# Patient Record
Sex: Female | Born: 1975
Health system: Southern US, Community
[De-identification: ages and names within clinical notes are randomized; demographics above are authoritative.]

## PROBLEM LIST (undated history)

## (undated) DIAGNOSIS — N200 Calculus of kidney: Secondary | ICD-10-CM

## (undated) HISTORY — PX: WISDOM TOOTH EXTRACTION: SHX21

## (undated) HISTORY — DX: Calculus of kidney: N20.0

---

## 2001-01-09 ENCOUNTER — Other Ambulatory Visit: Admission: RE | Admit: 2001-01-09 | Discharge: 2001-01-09 | Payer: Self-pay | Admitting: Gynecology

## 2002-04-22 ENCOUNTER — Other Ambulatory Visit: Admission: RE | Admit: 2002-04-22 | Discharge: 2002-04-22 | Payer: Self-pay | Admitting: Gynecology

## 2003-06-22 ENCOUNTER — Other Ambulatory Visit: Admission: RE | Admit: 2003-06-22 | Discharge: 2003-06-22 | Payer: Self-pay | Admitting: Gynecology

## 2003-10-02 ENCOUNTER — Inpatient Hospital Stay (HOSPITAL_COMMUNITY): Admission: AD | Admit: 2003-10-02 | Discharge: 2003-10-02 | Payer: Self-pay | Admitting: Obstetrics and Gynecology

## 2003-11-09 ENCOUNTER — Emergency Department (HOSPITAL_COMMUNITY): Admission: EM | Admit: 2003-11-09 | Discharge: 2003-11-09 | Payer: Self-pay | Admitting: Emergency Medicine

## 2004-06-22 ENCOUNTER — Other Ambulatory Visit: Admission: RE | Admit: 2004-06-22 | Discharge: 2004-06-22 | Payer: Self-pay | Admitting: Gynecology

## 2005-04-17 ENCOUNTER — Other Ambulatory Visit: Admission: RE | Admit: 2005-04-17 | Discharge: 2005-04-17 | Payer: Self-pay | Admitting: Gynecology

## 2005-09-11 ENCOUNTER — Inpatient Hospital Stay (HOSPITAL_COMMUNITY): Admission: AD | Admit: 2005-09-11 | Discharge: 2005-09-13 | Payer: Self-pay | Admitting: Gynecology

## 2005-10-26 ENCOUNTER — Other Ambulatory Visit: Admission: RE | Admit: 2005-10-26 | Discharge: 2005-10-26 | Payer: Self-pay | Admitting: Gynecology

## 2006-10-24 ENCOUNTER — Other Ambulatory Visit: Admission: RE | Admit: 2006-10-24 | Discharge: 2006-10-24 | Payer: Self-pay | Admitting: Surgical Oncology

## 2007-10-29 ENCOUNTER — Other Ambulatory Visit: Admission: RE | Admit: 2007-10-29 | Discharge: 2007-10-29 | Payer: Self-pay | Admitting: Gynecology

## 2008-10-29 ENCOUNTER — Ambulatory Visit: Payer: Self-pay | Admitting: Gynecology

## 2008-10-29 ENCOUNTER — Encounter: Payer: Self-pay | Admitting: Gynecology

## 2008-10-29 ENCOUNTER — Other Ambulatory Visit: Admission: RE | Admit: 2008-10-29 | Discharge: 2008-10-29 | Payer: Self-pay | Admitting: Gynecology

## 2009-11-17 ENCOUNTER — Other Ambulatory Visit: Admission: RE | Admit: 2009-11-17 | Discharge: 2009-11-17 | Payer: Self-pay | Admitting: Gynecology

## 2009-11-17 ENCOUNTER — Ambulatory Visit: Payer: Self-pay | Admitting: Gynecology

## 2010-05-09 ENCOUNTER — Ambulatory Visit (INDEPENDENT_AMBULATORY_CARE_PROVIDER_SITE_OTHER): Payer: 59 | Admitting: Gynecology

## 2010-05-09 DIAGNOSIS — N949 Unspecified condition associated with female genital organs and menstrual cycle: Secondary | ICD-10-CM

## 2010-05-09 DIAGNOSIS — Z1211 Encounter for screening for malignant neoplasm of colon: Secondary | ICD-10-CM

## 2010-05-11 ENCOUNTER — Other Ambulatory Visit: Payer: 59

## 2010-05-11 ENCOUNTER — Ambulatory Visit: Payer: 59 | Admitting: Gynecology

## 2010-05-15 ENCOUNTER — Ambulatory Visit (INDEPENDENT_AMBULATORY_CARE_PROVIDER_SITE_OTHER): Payer: 59 | Admitting: Gynecology

## 2010-05-15 ENCOUNTER — Other Ambulatory Visit: Payer: 59

## 2010-05-15 DIAGNOSIS — N831 Corpus luteum cyst of ovary, unspecified side: Secondary | ICD-10-CM

## 2010-05-15 DIAGNOSIS — N949 Unspecified condition associated with female genital organs and menstrual cycle: Secondary | ICD-10-CM

## 2010-05-15 DIAGNOSIS — Z30431 Encounter for routine checking of intrauterine contraceptive device: Secondary | ICD-10-CM

## 2010-10-10 ENCOUNTER — Encounter: Payer: Self-pay | Admitting: *Deleted

## 2010-10-10 ENCOUNTER — Telehealth: Payer: Self-pay | Admitting: *Deleted

## 2010-10-10 NOTE — Progress Notes (Signed)
Ca

## 2010-10-10 NOTE — Telephone Encounter (Signed)
Called patient about IUD benefits.  Her part would be $1008. Patient decided she will just have aex appointment and probably just remove IUD and not insert because of cost. Updated appointment info.

## 2010-11-13 ENCOUNTER — Encounter: Payer: Self-pay | Admitting: Anesthesiology

## 2010-11-16 ENCOUNTER — Ambulatory Visit: Payer: 59 | Admitting: Gynecology

## 2010-11-21 ENCOUNTER — Encounter: Payer: Self-pay | Admitting: Gynecology

## 2010-11-21 ENCOUNTER — Other Ambulatory Visit (HOSPITAL_COMMUNITY)
Admission: RE | Admit: 2010-11-21 | Discharge: 2010-11-21 | Disposition: A | Discharge status: Home / Self Care | Payer: 59 | Source: Ambulatory Visit | Attending: Gynecology | Admitting: Gynecology

## 2010-11-21 ENCOUNTER — Other Ambulatory Visit: Payer: Self-pay | Admitting: Gynecology

## 2010-11-21 ENCOUNTER — Ambulatory Visit (INDEPENDENT_AMBULATORY_CARE_PROVIDER_SITE_OTHER): Payer: 59 | Admitting: Gynecology

## 2010-11-21 DIAGNOSIS — Z13220 Encounter for screening for lipoid disorders: Secondary | ICD-10-CM

## 2010-11-21 DIAGNOSIS — Z3201 Encounter for pregnancy test, result positive: Secondary | ICD-10-CM

## 2010-11-21 DIAGNOSIS — R82998 Other abnormal findings in urine: Secondary | ICD-10-CM

## 2010-11-21 DIAGNOSIS — Z01419 Encounter for gynecological examination (general) (routine) without abnormal findings: Secondary | ICD-10-CM | POA: Insufficient documentation

## 2010-11-21 NOTE — Progress Notes (Signed)
Heather Anderson 1975/06/19 [id number]   History:    35 y.o.  for annual exam without any complaints. Patient is due to have her Mirena IUD removed today. Patient will be using barrier contraception until she schedules ferning Mirena IUD to be placed in a few months. She does her monthly self breast examination.  Past medical history,surgical history, family history and social history were all reviewed and documented in the EPIC chart.  ROS:  Was performed and pertinent positives and negatives are included in the history.  Exam: chaperone present There were no vitals taken for this visit.  There is no height or weight on file to calculate BMI.  General appearance : Well developed well nourished female. No acute distress HEENT: Neck supple, trachea midline, no carotid bruits, no thyroidmegaly Lungs: Clear to auscultation, no rhonchi or wheezes, or rib retractions  Heart: Regular rate and rhythm, no murmurs or gallops Breast:Examined in sitting and supine position were symmetrical in appearance, no palpable masses or tenderness,  no skin retraction, no nipple inversion, no nipple discharge, no skin discoloration, no axillary or supraclavicular lymphadenopathy Abdomen: no palpable masses or tenderness, no rebound or guarding Extremities: no edema or skin discoloration or tenderness  Pelvic:  Bartholin, Urethra, Skene Glands: Within normal limits             Vagina: No gross lesions or discharge  Cervix: No gross lesions or discharge  Uterus  anteverted, normal size, shape and consistency, non-tender and mobile  Adnexa  Without masses or tenderness  Anus and perineum  normal   Rectovaginal  normal sphincter tone without palpated masses or tenderness             Hemoccult not done     Assessment/Plan:  35 y.o. female for annual exam unremarkable. Mirena IUD was removed in a sterile fashion shown to the patient and discarded. CBC urinalysis cholesterol and Pap smear was done today. Requisition  to schedule her baseline mammogram was provided. She was encouraged to continue monthly self breast examination. See her back in one year or when necessary.    Ok Edwards MD, 11:33 AM 11/21/2010

## 2010-11-24 ENCOUNTER — Telehealth: Payer: Self-pay | Admitting: *Deleted

## 2010-11-24 NOTE — Telephone Encounter (Signed)
Pt informed that post bleeding after IUD removal is normal, told pt to monitor bleeding and call office if bleeding continues.

## 2010-11-24 NOTE — Telephone Encounter (Signed)
Pt called c/o bleeding from post iud removal. Left message for pt to call.

## 2011-02-13 ENCOUNTER — Telehealth: Payer: Self-pay | Admitting: *Deleted

## 2011-02-13 NOTE — Telephone Encounter (Signed)
Patient states ready to discuss birth control options.  Appts to set up with JF.

## 2011-02-15 ENCOUNTER — Ambulatory Visit (INDEPENDENT_AMBULATORY_CARE_PROVIDER_SITE_OTHER): Payer: 59 | Admitting: Gynecology

## 2011-02-15 ENCOUNTER — Encounter: Payer: Self-pay | Admitting: Gynecology

## 2011-02-15 VITALS — BP 112/70

## 2011-02-15 DIAGNOSIS — Z309 Encounter for contraceptive management, unspecified: Secondary | ICD-10-CM

## 2011-02-15 DIAGNOSIS — IMO0001 Reserved for inherently not codable concepts without codable children: Secondary | ICD-10-CM

## 2011-02-15 DIAGNOSIS — Z23 Encounter for immunization: Secondary | ICD-10-CM

## 2011-02-15 MED ORDER — LEVONORGESTREL-ETHINYL ESTRAD 0.1-20 MG-MCG PO TABS: 1.0000 | ORAL_TABLET | Freq: Every day | ORAL | Status: DC

## 2011-02-15 NOTE — Progress Notes (Signed)
Patient is a 35 year old gravida 3 or 1 AB 2 who was seen recently in the office for her annual gynecological examination and her Mirena IUD was due for removal. She was going to wait and use barrier contraception and presented to the office today for consultation. The Mirena IUD was removed and the last office visit. She's currently on her second day of her menstrual cycle. Because of her high insurance deductible she would like to wait for later in the year to have the Mirena IUD replaced and was here to discuss alternatives. We will with doing a lengthy discussion of contraceptive options to include the following:  A. Depo-Provera every 3 months B. Nexplanon subdermal implant to 3 years C. Oral contraceptive pill daily D barrier contraception E. Laparoscopic tubal ligation F. Hysteroscopic sterilization  Patient is going to be remarried and within the next 2 years and would like to maintain her fertility. She had stated several years ago she had been on oral contraceptive pills to regulate her cycles since she has menorrhagia. She denies any family history of any thrombophilia. She is a nonsmoker. The risks benefits and pros and cons of oral contraceptive pills at the age of 80 were discussed to include DVT. She fully understands and accepts. She will be placed on Alesse 35 year old contraceptive pill which she will start tomorrow. She requested to have the flu vaccine placed today which is administered. Her recent Pap smear and labs were all normal and we will see her back in the year for her annual exam or when necessary.

## 2011-02-15 NOTE — Patient Instructions (Signed)
Start oral contraception tomorrow. Prescription called in.

## 2011-02-15 NOTE — Progress Notes (Signed)
Addended by: Bertram Savin A on: 02/15/2011 10:15 AM   Modules accepted: Orders

## 2011-05-08 ENCOUNTER — Telehealth: Payer: Self-pay | Admitting: *Deleted

## 2011-05-08 NOTE — Telephone Encounter (Signed)
Pt c/o bleeding with birth control pills. She had her IUD removed in 2012, pt said that she missed 1 day and double up on next day and now is having bleeding. Bleeding started last week, pt said this is the normal week her period would start & she has been until a lot of stress. I told pt to watch for now and call back if bleeding continues to call. Pt okay with this and will do.

## 2011-11-29 ENCOUNTER — Encounter: Payer: Self-pay | Admitting: Gynecology

## 2011-11-29 ENCOUNTER — Ambulatory Visit (INDEPENDENT_AMBULATORY_CARE_PROVIDER_SITE_OTHER): Payer: Self-pay | Admitting: Gynecology

## 2011-11-29 VITALS — BP 128/74 | Ht 60.0 in | Wt 116.0 lb

## 2011-11-29 DIAGNOSIS — Z01419 Encounter for gynecological examination (general) (routine) without abnormal findings: Secondary | ICD-10-CM

## 2011-11-29 NOTE — Progress Notes (Signed)
Heather Anderson 02-Feb-1976 [id number]   History:    36 y.o.  for annual gyn exam with no complaints today. Patient had a Mirena IUD removed in 2002. She is on Alesse 28 day oral contraceptive pills and is having normal menstrual cycle. Patient does her monthly self breast examination. No prior history of abnormal Pap smears. Last normal Pap smear 2012 normal. Patient has not received a dTap Vaccine.  Past medical history,surgical history, family history and social history were all reviewed and documented in the EPIC chart.  Gynecologic History Patient's last menstrual period was 11/21/2011. Contraception: OCP (estrogen/progesterone) Last Pap: 2012. Results were: normal Last mammogram: Not indicated. Results were: Not indicated  Obstetric History OB History    Grav Para Term Preterm Abortions TAB SAB Ect Mult Living   3 1 1  2  2   1      # Outc Date GA Lbr Len/2nd Wgt Sex Del Anes PTL Lv   1 TRM     M SVD  No Yes   2 SAB            3 SAB                ROS: A ROS was performed and pertinent positives and negatives are included in the history.  GENERAL: No fevers or chills. HEENT: No change in vision, no earache, sore throat or sinus congestion. NECK: No pain or stiffness. CARDIOVASCULAR: No chest pain or pressure. No palpitations. PULMONARY: No shortness of breath, cough or wheeze. GASTROINTESTINAL: No abdominal pain, nausea, vomiting or diarrhea, melena or bright red blood per rectum. GENITOURINARY: No urinary frequency, urgency, hesitancy or dysuria. MUSCULOSKELETAL: No joint or muscle pain, no back pain, no recent trauma. DERMATOLOGIC: No rash, no itching, no lesions. ENDOCRINE: No polyuria, polydipsia, no heat or cold intolerance. No recent change in weight. HEMATOLOGICAL: No anemia or easy bruising or bleeding. NEUROLOGIC: No headache, seizures, numbness, tingling or weakness. PSYCHIATRIC: No depression, no loss of interest in normal activity or change in sleep pattern.      Exam: chaperone present  BP 128/74  Ht 5' (1.524 m)  Wt 116 lb (52.617 kg)  BMI 22.65 kg/m2  LMP 11/21/2011  Body mass index is 22.65 kg/(m^2).  General appearance : Well developed well nourished female. No acute distress HEENT: Neck supple, trachea midline, no carotid bruits, no thyroidmegaly Lungs: Clear to auscultation, no rhonchi or wheezes, or rib retractions  Heart: Regular rate and rhythm, no murmurs or gallops Breast:Examined in sitting and supine position were symmetrical in appearance, no palpable masses or tenderness,  no skin retraction, no nipple inversion, no nipple discharge, no skin discoloration, no axillary or supraclavicular lymphadenopathy Abdomen: no palpable masses or tenderness, no rebound or guarding Extremities: no edema or skin discoloration or tenderness  Pelvic:  Bartholin, Urethra, Skene Glands: Within normal limits             Vagina: No gross lesions or discharge  Cervix: No gross lesions or discharge  Uterus  anteverted, normal size, shape and consistency, non-tender and mobile  Adnexa  Without masses or tenderness  Anus and perineum  normal   Rectovaginal  normal sphincter tone without palpated masses or tenderness             Hemoccult not done     Assessment/Plan:  35 y.o. female for annual exam who was to receive her dTap Vaccine today. No abnormalities on gynecological examination. The following labs were were today: CBC, TSH, screening  cholesterol, random blood sugar, urinalysis. We discussed the new Pap smear screening guidelines and she will not need a Pap smear this year. She was reminded on the importance of monthly self breast examination. We also discussed importance of calcium vitamin D for osteoporosis prevention.    Ok Edwards MD, 6:18 PM 11/29/2011

## 2011-11-29 NOTE — Patient Instructions (Addendum)
Diphtheria, Tetanus, and Pertussis (DTaP) Vaccine What You Need to Know WHY GET VACCINATED? Diphtheria, tetanus, and pertussis are serious diseases caused by bacteria. Diphtheria and pertussis are spread from person to person. Tetanus enters the body through cuts or wounds. Diphtheria causes a thick covering in the back of the throat.  It can lead to breathing problems, paralysis, heart failure, and even death.  Tetanus (Lockjaw) causes painful tightening of the muscles, usually all over the body.  It can lead to "locking" of the jaw so the victim cannot open his or her mouth or swallow. Tetanus leads to death in about 2 out of 10 cases.  Pertussis (Whooping Cough) causes coughing spells so bad that it is hard for infants to eat, drink, or breathe. These spells can last for weeks.  It can lead to pneumonia, seizures (jerking and staring spells), brain damage, and death.  Diphtheria, tetanus, and pertussis vaccine (DTaP) can help prevent these diseases. Most children who are vaccinated with DTaP will be protected throughout childhood. Many more children would get these diseases if we stopped vaccinating. DTaP is a safer version of an older vaccine called DTP. DTP is no longer used in the [place]. WHO SHOULD GET DTAP VACCINE AND WHEN? Children should get 5 doses of DTaP vaccine, 1 dose at each of the following ages:  2 months.   4 months.   6 months.   15 to 18 months.   4 to 6 years.  DTaP may be given at the same time as other vaccines. SOME CHILDREN SHOULD NOT GET DTAP VACCINE OR SHOULD WAIT  Children with minor illnesses, such as a cold, may be vaccinated. But children who are moderately or severely ill should usually wait until they recover before getting DTaP vaccine.   Any child who had a life-threatening allergic reaction after a dose of DTaP should not get another dose.   Any child who suffered a brain or nervous system disease within 7 days after a dose of DTaP should  not get another dose.   Talk with your caregiver if your child:   Had a seizure or collapsed after a dose of DTaP.   Cried non-stop for 3 hours or more after a dose of DTaP.   Had a fever over 105 F (40.6 C) after a dose of DTaP.   Ask your caregiver for more information. Some of these children should not get another dose of pertussis vaccine, but may get a vaccine without pertussis, called DT.  OLDER CHILDREN AND ADULTS  DTaP is not licensed for adolescents, adults, or children 1 years of age and older.   But older people still need protection. A vaccine called Tdap is similar to DTaP. A single dose of Tdap is recommended for people 11 through 36 years of age. Another vaccine, called Td, protects against tetanus and diphtheria, but not pertussis. It is recommended every 10 years.  WHAT ARE THE RISKS FROM DTAP VACCINE?  Getting diphtheria, tetanus, or pertussis disease is much riskier than getting DTaP vaccine.   However, a vaccine, like any medicine, is capable of causing serious problems, such as severe allergic reactions. The risk of DTaP vaccine causing serious harm, or death, is extremely small.  Mild Problems (Common)  Fever (up to about 1 child in 4).   Redness or swelling where the shot was given (up to about 1 child in 4).   Soreness or tenderness where the shot was given (up to about 1 child in 4).  These problems occur more often after the 4th and 5th doses of the DTaP series than after earlier doses. Sometimes the 4th or 5th dose of DTaP vaccine is followed by swelling of the entire arm or leg in which the shot was given, lasting 1 to 7 days (up to about 1 child in 30). Other mild problems include:  Fussiness (up to about 1 child in 3).   Tiredness or poor appetite (up to about 1 child in 10).   Vomiting (up to about 1 child in 50).  These problems generally occur 1 to 3 days after the shot. Moderate Problems (Uncommon)  Seizure (jerking or staring) (about 1 child  out of 14,000).   Non-stop crying, for 3 hours or more (up to about 1 child out of 1,000).   High fever, over 105 F (40.6 C) (about 1 child out of 16,000).  Severe Problems (Very Rare)  Serious allergic reaction (less than 1 out of a million doses).   Several other severe problems have been reported after DTaP vaccine. These include:   Long-term seizures, coma, or lowered consciousness.   Permanent brain damage.  These are so rare it is hard to tell if they are caused by the vaccine. Controlling fever is especially important for children who have had seizures, for any reason. It is also important if another family member has had seizures. You can reduce fever and pain by giving your child an aspirin-free pain reliever when the shot is given, and for the next 24 hours, following the package instructions. WHAT IF THERE IS A MODERATE OR SEVERE REACTION? What should I look for? Any unusual conditions, such as a serious allergic reaction, high fever, or unusual behavior. Serious allergic reactions are extremely rare with any vaccine. If one were to occur, it would most likely be within a few minutes to a few hours after the shot. Signs can include difficulty breathing, hoarseness or wheezing, hives, paleness, weakness, a fast heartbeat, or dizziness. If a high fever or seizure were to occur, it would usually be within a week after the shot. What should I do?  Call your caregiver or get the person to a caregiver right away.   Tell the caregiver what happened, the date and time it happened, and when the vaccination was given.   Ask the caregiver, nurse, or health department to file a Vaccine Adverse Event Reporting System (VAERS) form. Or, you can file this report through the VAERS website at www.vaers.LAgents.no or by calling 1-(563)628-9859.  VAERS does not provide medical advice. THE NATIONAL VACCINE INJURY COMPENSATION PROGRAM  In the rare event that you or your child has a serious reaction  to a vaccine, a federal program has been created to help you pay for the care of those who have been harmed.   For details about the National Vaccine Injury Compensation Program, call 315-830-0253 or visit the program's website at SpiritualWord.at  HOW CAN I LEARN MORE?  Ask your caregiver. They can give you the vaccine package insert or suggest other sources of information.   Call your local or state health department's immunization program.   Contact the Centers for Disease Control and Prevention (CDC):   Call 778-515-9892 (1-800-CDC-INFO).   Visit the [employer] at PicCapture.uy  CDC Diphtheria, Tetanus, and Pertussis (DTaP) Vaccine VIS (07/19/05) Document Released: 12/17/2005 Document Revised: 02/08/2011 Document Reviewed: 12/17/2005 [facility] Patient Information 2012 [place], [place].

## 2011-11-30 ENCOUNTER — Encounter: Payer: Self-pay | Admitting: Gynecology

## 2011-11-30 LAB — TSH: TSH: 0.428 u[IU]/mL (ref 0.350–4.500)

## 2011-11-30 LAB — GLUCOSE, RANDOM: Glucose, Bld: 73 mg/dL (ref 70–99)

## 2011-11-30 LAB — CBC WITH DIFFERENTIAL/PLATELET
Basophils Relative: 1 % (ref 0–1)
Hemoglobin: 13.4 g/dL (ref 12.0–15.0)
MCV: 84.4 fL (ref 78.0–100.0)
Neutro Abs: 2.4 10*3/uL (ref 1.7–7.7)
Neutrophils Relative %: 50 % (ref 43–77)
RBC: 4.81 MIL/uL (ref 3.87–5.11)
RDW: 14 % (ref 11.5–15.5)
WBC: 4.7 10*3/uL (ref 4.0–10.5)

## 2012-01-18 ENCOUNTER — Other Ambulatory Visit: Payer: Self-pay | Admitting: *Deleted

## 2012-01-18 MED ORDER — LEVONORGESTREL-ETHINYL ESTRAD 0.1-20 MG-MCG PO TABS: 1.0000 | ORAL_TABLET | Freq: Every day | ORAL | Status: DC

## 2012-03-12 ENCOUNTER — Other Ambulatory Visit: Payer: Self-pay

## 2012-03-12 MED ORDER — LEVONORGESTREL-ETHINYL ESTRAD 0.1-20 MG-MCG PO TABS: 1.0000 | ORAL_TABLET | Freq: Every day | ORAL | Status: DC

## 2012-11-14 ENCOUNTER — Other Ambulatory Visit: Payer: Self-pay | Admitting: Gynecology

## 2012-12-18 ENCOUNTER — Other Ambulatory Visit: Payer: Self-pay | Admitting: Gynecology

## 2012-12-24 ENCOUNTER — Ambulatory Visit (INDEPENDENT_AMBULATORY_CARE_PROVIDER_SITE_OTHER): Payer: Self-pay | Admitting: Gynecology

## 2012-12-24 ENCOUNTER — Encounter: Payer: Self-pay | Admitting: Gynecology

## 2012-12-24 VITALS — BP 110/70 | Ht 60.0 in | Wt 111.0 lb

## 2012-12-24 DIAGNOSIS — Z23 Encounter for immunization: Secondary | ICD-10-CM

## 2012-12-24 DIAGNOSIS — Z01419 Encounter for gynecological examination (general) (routine) without abnormal findings: Secondary | ICD-10-CM

## 2012-12-24 LAB — CBC WITH DIFFERENTIAL/PLATELET
Basophils Absolute: 0 10*3/uL (ref 0.0–0.1)
Basophils Relative: 1 % (ref 0–1)
Eosinophils Absolute: 0 10*3/uL (ref 0.0–0.7)
Eosinophils Relative: 1 % (ref 0–5)
HCT: 39.9 % (ref 36.0–46.0)
Hemoglobin: 13.5 g/dL (ref 12.0–15.0)
Lymphocytes Relative: 59 % — ABNORMAL HIGH (ref 12–46)
Lymphs Abs: 1.8 10*3/uL (ref 0.7–4.0)
MCH: 28.1 pg (ref 26.0–34.0)
MCHC: 33.8 g/dL (ref 30.0–36.0)
MCV: 83.1 fL (ref 78.0–100.0)
Monocytes Absolute: 0.1 10*3/uL (ref 0.1–1.0)
Monocytes Relative: 5 % (ref 3–12)
Neutro Abs: 1.1 10*3/uL — ABNORMAL LOW (ref 1.7–7.7)
Neutrophils Relative %: 34 % — ABNORMAL LOW (ref 43–77)
Platelets: 240 10*3/uL (ref 150–400)
RBC: 4.8 MIL/uL (ref 3.87–5.11)
RDW: 14.1 % (ref 11.5–15.5)
WBC: 3 10*3/uL — ABNORMAL LOW (ref 4.0–10.5)

## 2012-12-24 LAB — HEMOGLOBIN A1C
Hgb A1c MFr Bld: 5.5 % (ref <5.7)
Mean Plasma Glucose: 111 mg/dL (ref <117)

## 2012-12-24 LAB — URINALYSIS W MICROSCOPIC + REFLEX CULTURE
Bacteria, UA: NONE SEEN
Bilirubin Urine: NEGATIVE
Crystals: NONE SEEN
Leukocytes, UA: NEGATIVE
Protein, ur: NEGATIVE mg/dL
Specific Gravity, Urine: 1.025 (ref 1.005–1.030)
Urobilinogen, UA: 1 mg/dL (ref 0.0–1.0)

## 2012-12-24 MED ORDER — LEVONORGESTREL-ETHINYL ESTRAD 0.1-20 MG-MCG PO TABS: 1.0000 | ORAL_TABLET | Freq: Every day | ORAL | Status: DC

## 2012-12-24 NOTE — Patient Instructions (Signed)
Influenza Vaccine (Flu Vaccine, Inactivated) 2013 2014 What You Need to Know WHY GET VACCINATED?  Influenza ("flu") is a contagious disease that spreads around the [place] every winter, usually between October and May.  Flu is caused by the influenza virus, and can be spread by coughing, sneezing, and close contact.  Anyone can get flu, but the risk of getting flu is highest among children. Symptoms come on suddenly and may last several days. They can include:  Fever or chills.  Sore throat.  Muscle aches.  Fatigue.  Cough.  Headache.  Runny or stuffy nose. Flu can make some people much sicker than others. These people include young children, people 65 and older, pregnant women, and people with certain health conditions such as heart, lung or kidney disease, or a weakened immune system. Flu vaccine is especially important for these people, and anyone in close contact with them. Flu can also lead to pneumonia, and make existing medical conditions worse. It can cause diarrhea and seizures in children. Each year thousands of people in the [place] die from flu, and many more are hospitalized. Flu vaccine is the best protection we have from flu and its complications. Flu vaccine also helps prevent spreading flu from person to person. INACTIVATED FLU VACCINE There are 2 types of influenza vaccine:  You are getting an inactivated flu vaccine, which does not contain any live influenza virus. It is given by injection with a needle, and often called the "flu shot."  A different live, attenuated (weakened) influenza vaccine is sprayed into the nostrils. This vaccine is described in a separate Vaccine Information Statement. Flu vaccine is recommended every year. Children 6 months through 8 years of age should get 2 doses the first year they get vaccinated. Flu viruses are always changing. Each year's flu vaccine is made to protect from viruses that are most likely to cause disease  that year. While flu vaccine cannot prevent all cases of flu, it is our best defense against the disease. Inactivated flu vaccine protects against 3 or 4 different influenza viruses. It takes about 2 weeks for protection to develop after the vaccination, and protection lasts several months to a year. Some illnesses that are not caused by influenza virus are often mistaken for flu. Flu vaccine will not prevent these illnesses. It can only prevent influenza. A "high-dose" flu vaccine is available for people 65 years of age and older. The person giving you the vaccine can tell you more about it. Some inactivated flu vaccine contains a very small amount of a mercury-based preservative called thimerosal. Studies have shown that thimerosal in vaccines is not harmful, but flu vaccines that do not contain a preservative are available. SOME PEOPLE SHOULD NOT GET THIS VACCINE Tell the person who gives you the vaccine:  If you have any severe (life-threatening) allergies. If you ever had a life-threatening allergic reaction after a dose of flu vaccine, or have a severe allergy to any part of this vaccine, you may be advised not to get a dose. Most, but not all, types of flu vaccine contain a small amount of egg.  If you ever had Guillain Barr Syndrome (a severe paralyzing illness, also called GBS). Some people with a history of GBS should not get this vaccine. This should be discussed with your doctor.  If you are not feeling well. They might suggest waiting until you feel better. But you should come back. RISKS OF A VACCINE REACTION With a vaccine, like any medicine, there   is a chance of side effects. These are usually mild and go away on their own. Serious side effects are also possible, but are very rare. Inactivated flu vaccine does not contain live flu virus, sogetting flu from this vaccine is not possible. Brief fainting spells and related symptoms (such as jerking movements) can happen after any medical  procedure, including vaccination. Sitting or lying down for about 15 minutes after a vaccination can help prevent fainting and injuries caused by falls. Tell your doctor if you feel dizzy or lightheaded, or have vision changes or ringing in the ears. Mild problems following inactivated flu vaccine:  Soreness, redness, or swelling where the shot was given.  Hoarseness; sore, red or itchy eyes; or cough.  Fever.  Aches.  Headache.  Itching.  Fatigue. If these problems occur, they usually begin soon after the shot and last 1 or 2 days. Moderate problems following inactivated flu vaccine:  Young children who get inactivated flu vaccine and pneumococcal vaccine (PCV13) at the same time may be at increased risk for seizures caused by fever. Ask your doctor for more information. Tell your doctor if a child who is getting flu vaccine has ever had a seizure. Severe problems following inactivated flu vaccine:  A severe allergic reaction could occur after any vaccine (estimated less than 1 in a million doses).  There is a small possibility that inactivated flu vaccine could be associated with Guillan Barr Syndrome (GBS), no more than 1 or 2 cases per million people vaccinated. This is much lower than the risk of severe complications from flu, which can be prevented by flu vaccine. The safety of vaccines is always being monitored. For more information, visit: www.cdc.gov/vaccinesafety/ WHAT IF THERE IS A SERIOUS REACTION? What should I look for?  Look for anything that concerns you, such as signs of a severe allergic reaction, very high fever, or behavior changes. Signs of a severe allergic reaction can include hives, swelling of the face and throat, difficulty breathing, a fast heartbeat, dizziness, and weakness. These would start a few minutes to a few hours after the vaccination. What should I do?  If you think it is a severe allergic reaction or other emergency that cannot wait, call 9 1 1  or get the person to the nearest hospital. Otherwise, call your doctor.  Afterward, the reaction should be reported to the Vaccine Adverse Event Reporting System (VAERS). Your doctor might file this report, or you can do it yourself through the VAERS website at www.vaers.hhs.gov, or by calling 1-800-822-7967. VAERS is only for reporting reactions. They do not give medical advice. THE NATIONAL VACCINE INJURY COMPENSATION PROGRAM The National Vaccine Injury Compensation Program (VICP) is a federal program that was created to compensate people who may have been injured by certain vaccines. Persons who believe they may have been injured by a vaccine can learn about the program and about filing a claim by calling 1-800-338-2382 or visiting the VICP website at www.hrsa.gov/vaccinecompensation HOW CAN I LEARN MORE?  Ask your doctor.  Call your local or state health department.  Contact the Centers for Disease Control and Prevention (CDC):  Call 1-800-232-4636 (1-800-CDC-INFO) or  Visit CDC's website at www.cdc.gov/flu CDC Inactivated Influenza Vaccine Interim VIS (09/28/11) Document Released: 12/14/2005 Document Revised: 11/14/2011 Document Reviewed: 09/28/2011 [employer] Patient Information 2014 [employer].  

## 2012-12-24 NOTE — Progress Notes (Signed)
Heather Anderson Feb 25, 1976 [id number]   History:    37 y.o.  for annual gyn Patient had a Mirena IUD removed in 2002. She is on Alesse 28 day oral contraceptive pills and is having normal menstrual cycle. Patient does her monthly self breast examination. No prior history of abnormal Pap smears. Last normal Pap smear 2012 normal.    Past medical history,surgical history, family history and social history were all reviewed and documented in the EPIC chart.  Gynecologic History Patient's last menstrual period was 12/16/2012. Contraception: OCP (estrogen/progesterone) Last Pap: 2012. Results were: normal Last mammogram: none indicated. Results were: none indicated  Obstetric History OB History  Gravida Para Term Preterm AB SAB TAB Ectopic Multiple Living  3 1 1  2 2    1     # Outcome Date GA Lbr Len/2nd Weight Sex Delivery Anes PTL Lv  3 SAB           2 SAB           1 TRM     M SVD  N Y       ROS: A ROS was performed and pertinent positives and negatives are included in the history.  GENERAL: No fevers or chills. HEENT: No change in vision, no earache, sore throat or sinus congestion. NECK: No pain or stiffness. CARDIOVASCULAR: No chest pain or pressure. No palpitations. PULMONARY: No shortness of breath, cough or wheeze. GASTROINTESTINAL: No abdominal pain, nausea, vomiting or diarrhea, melena or bright red blood per rectum. GENITOURINARY: No urinary frequency, urgency, hesitancy or dysuria. MUSCULOSKELETAL: No joint or muscle pain, no back pain, no recent trauma. DERMATOLOGIC: No rash, no itching, no lesions. ENDOCRINE: No polyuria, polydipsia, no heat or cold intolerance. No recent change in weight. HEMATOLOGICAL: No anemia or easy bruising or bleeding. NEUROLOGIC: No headache, seizures, numbness, tingling or weakness. PSYCHIATRIC: No depression, no loss of interest in normal activity or change in sleep pattern.     Exam: chaperone present  BP 110/70  Ht 5' (1.524 m)  Wt 111 lb (50.349  kg)  BMI 21.68 kg/m2  LMP 12/16/2012  Body mass index is 21.68 kg/(m^2).  General appearance : Well developed well nourished female. No acute distress HEENT: Neck supple, trachea midline, no carotid bruits, no thyroidmegaly Lungs: Clear to auscultation, no rhonchi or wheezes, or rib retractions  Heart: Regular rate and rhythm, no murmurs or gallops Breast:Examined in sitting and supine position were symmetrical in appearance, no palpable masses or tenderness,  no skin retraction, no nipple inversion, no nipple discharge, no skin discoloration, no axillary or supraclavicular lymphadenopathy Abdomen: no palpable masses or tenderness, no rebound or guarding Extremities: no edema or skin discoloration or tenderness  Pelvic:  Bartholin, Urethra, Skene Glands: Within normal limits             Vagina: No gross lesions or discharge  Cervix: No gross lesions or discharge  Uterus  anteverted, normal size, shape and consistency, non-tender and mobile  Adnexa  Without masses or tenderness  Anus and perineum  normal   Rectovaginal  normal sphincter tone without palpated masses or tenderness             Hemoccult none indicated     Assessment/Plan:  37 y.o. female for annual exam with no abnormalities detected. Pap smear was not done today in accordance with the new guidelines. The following labs were ordered: CBC, TSH, screening cholesterol, urinalysis and hemoglobin A 1C. We discussed importance of calcium vitamin D and regular  exercise for osteoporosis prevention. We discussed importance of monthly self breast exam. Patient received the flu vaccine today.  Note: This dictation was prepared with  Dragon/digital dictation along withSmart phrase technology. Any transcriptional errors that result from this process are unintentional.   Ok Edwards MD, 3:23 PM 12/24/2012

## 2012-12-25 ENCOUNTER — Other Ambulatory Visit: Payer: Self-pay | Admitting: *Deleted

## 2012-12-25 DIAGNOSIS — R7989 Other specified abnormal findings of blood chemistry: Secondary | ICD-10-CM

## 2012-12-30 ENCOUNTER — Other Ambulatory Visit: Payer: Self-pay

## 2012-12-30 DIAGNOSIS — R7989 Other specified abnormal findings of blood chemistry: Secondary | ICD-10-CM

## 2012-12-30 LAB — CBC WITH DIFFERENTIAL/PLATELET
Basophils Absolute: 0 10*3/uL (ref 0.0–0.1)
Eosinophils Relative: 1 % (ref 0–5)
HCT: 40.2 % (ref 36.0–46.0)
Hemoglobin: 13.3 g/dL (ref 12.0–15.0)
Lymphocytes Relative: 37 % (ref 12–46)
Lymphs Abs: 1.9 10*3/uL (ref 0.7–4.0)
MCV: 83.2 fL (ref 78.0–100.0)
Monocytes Absolute: 0.2 10*3/uL (ref 0.1–1.0)
Neutrophils Relative %: 57 % (ref 43–77)

## 2013-12-16 ENCOUNTER — Other Ambulatory Visit: Payer: Self-pay | Admitting: Gynecology

## 2013-12-25 ENCOUNTER — Encounter: Payer: Self-pay | Admitting: Gynecology

## 2013-12-25 ENCOUNTER — Other Ambulatory Visit (HOSPITAL_COMMUNITY)
Admission: RE | Admit: 2013-12-25 | Discharge: 2013-12-25 | Disposition: A | Discharge status: Home / Self Care | Payer: BC Managed Care – PPO | Source: Ambulatory Visit | Attending: Gynecology | Admitting: Gynecology

## 2013-12-25 ENCOUNTER — Ambulatory Visit (INDEPENDENT_AMBULATORY_CARE_PROVIDER_SITE_OTHER): Payer: BC Managed Care – PPO | Admitting: Gynecology

## 2013-12-25 VITALS — BP 130/86 | Ht 59.5 in | Wt 112.0 lb

## 2013-12-25 DIAGNOSIS — F3281 Premenstrual dysphoric disorder: Secondary | ICD-10-CM

## 2013-12-25 DIAGNOSIS — Z01419 Encounter for gynecological examination (general) (routine) without abnormal findings: Secondary | ICD-10-CM

## 2013-12-25 DIAGNOSIS — Z1151 Encounter for screening for human papillomavirus (HPV): Secondary | ICD-10-CM | POA: Insufficient documentation

## 2013-12-25 DIAGNOSIS — Z23 Encounter for immunization: Secondary | ICD-10-CM

## 2013-12-25 DIAGNOSIS — G43821 Menstrual migraine, not intractable, with status migrainosus: Secondary | ICD-10-CM

## 2013-12-25 DIAGNOSIS — N943 Premenstrual tension syndrome: Secondary | ICD-10-CM

## 2013-12-25 MED ORDER — LEVONORGESTREL-ETHINYL ESTRAD 0.1-20 MG-MCG PO TABS: ORAL_TABLET | ORAL | Status: DC

## 2013-12-25 NOTE — Progress Notes (Signed)
Heather Anderson 1975-06-19 [id number]   History:    38 y.o.  for annual gyn exam with complaints of headaches before her menses as well as irritability mood swing and insomnia. Otherwise her menstrual cycles are regular. She has been on a 20 mcg 28 day oral contraceptive pill. She has always had normal Pap smears. Patient requesting flu vaccine today.  Past medical history,surgical history, family history and social history were all reviewed and documented in the EPIC chart.  Gynecologic History Patient's last menstrual period was 12/14/2013. Contraception: OCP (estrogen/progesterone) Last Pap: 2012. Results were: normal Last mammogram: Not indicated. Results were: normal  Obstetric History OB History  Gravida Para Term Preterm AB SAB TAB Ectopic Multiple Living  3 1 1  2 2    1     # Outcome Date GA Lbr Len/2nd Weight Sex Delivery Anes PTL Lv  3 SAB           2 SAB           1 TRM     M SVD  N Y       ROS: A ROS was performed and pertinent positives and negatives are included in the history.  GENERAL: No fevers or chills. HEENT: No change in vision, no earache, sore throat or sinus congestion. NECK: No pain or stiffness. CARDIOVASCULAR: No chest pain or pressure. No palpitations. PULMONARY: No shortness of breath, cough or wheeze. GASTROINTESTINAL: No abdominal pain, nausea, vomiting or diarrhea, melena or bright red blood per rectum. GENITOURINARY: No urinary frequency, urgency, hesitancy or dysuria. MUSCULOSKELETAL: No joint or muscle pain, no back pain, no recent trauma. DERMATOLOGIC: No rash, no itching, no lesions. ENDOCRINE: No polyuria, polydipsia, no heat or cold intolerance. No recent change in weight. HEMATOLOGICAL: No anemia or easy bruising or bleeding. NEUROLOGIC: No headache, seizures, numbness, tingling or weakness. PSYCHIATRIC: No depression, no loss of interest in normal activity or change in sleep pattern.     Exam: chaperone present  BP 130/86  Ht 4' 11.5" (1.511  m)  Wt 112 lb (50.803 kg)  BMI 22.25 kg/m2  LMP 12/14/2013  Body mass index is 22.25 kg/(m^2).  General appearance : Well developed well nourished female. No acute distress HEENT: Neck supple, trachea midline, no carotid bruits, no thyroidmegaly Lungs: Clear to auscultation, no rhonchi or wheezes, or rib retractions  Heart: Regular rate and rhythm, no murmurs or gallops Breast:Examined in sitting and supine position were symmetrical in appearance, no palpable masses or tenderness,  no skin retraction, no nipple inversion, no nipple discharge, no skin discoloration, no axillary or supraclavicular lymphadenopathy Abdomen: no palpable masses or tenderness, no rebound or guarding Extremities: no edema or skin discoloration or tenderness  Pelvic:  Bartholin, Urethra, Skene Glands: Within normal limits             Vagina: No gross lesions or discharge  Cervix: No gross lesions or discharge  Uterus  anteverted, normal size, shape and consistency, non-tender and mobile  Adnexa  Without masses or tenderness  Anus and perineum  normal   Rectovaginal  normal sphincter tone without palpated masses or tenderness             Hemoccult not indicated     Assessment/Plan:  38 y.o. female for annual exam with evidence of PMDD and menstrual migraines. Patient will be instructed to take her oral contraceptive pill daily and withdrawal every 3 months to help with these symptoms. Patient is a nonsmoker. Patient will return back  next week in a fasting state for the following lab work: Fasting lipid profile, comprehensive metabolic panel, TSH, CBC, and urinalysis. Pap smear was done today. We discussed importance of monthly breast exam. Patient received the flu vaccine today.   Ok EdwardsFERNANDEZ,JUAN H MD, 10:00 AM 12/25/2013

## 2013-12-25 NOTE — Patient Instructions (Signed)

## 2013-12-28 LAB — CYTOLOGY - PAP

## 2013-12-30 ENCOUNTER — Other Ambulatory Visit: Payer: BC Managed Care – PPO

## 2014-01-04 ENCOUNTER — Encounter: Payer: Self-pay | Admitting: Gynecology

## 2014-01-05 ENCOUNTER — Other Ambulatory Visit: Payer: Self-pay | Admitting: Gynecology

## 2014-01-07 ENCOUNTER — Other Ambulatory Visit: Payer: BC Managed Care – PPO

## 2014-01-07 DIAGNOSIS — Z01419 Encounter for gynecological examination (general) (routine) without abnormal findings: Secondary | ICD-10-CM

## 2014-01-07 LAB — LIPID PANEL
CHOL/HDL RATIO: 2.2 ratio
Cholesterol: 137 mg/dL (ref 0–200)
HDL: 62 mg/dL (ref >39)
LDL Cholesterol: 64 mg/dL (ref 0–99)
Triglycerides: 53 mg/dL (ref <150)
VLDL: 11 mg/dL (ref 0–40)

## 2014-01-07 LAB — COMPREHENSIVE METABOLIC PANEL
ALBUMIN: 3.6 g/dL (ref 3.5–5.2)
ALK PHOS: 31 U/L — AB (ref 39–117)
ALT: 10 U/L (ref 0–35)
AST: 13 U/L (ref 0–37)
BILIRUBIN TOTAL: 0.6 mg/dL (ref 0.2–1.2)
BUN: 11 mg/dL (ref 6–23)
CALCIUM: 8.6 mg/dL (ref 8.4–10.5)
CHLORIDE: 105 meq/L (ref 96–112)
CO2: 22 mEq/L (ref 19–32)
CREATININE: 0.66 mg/dL (ref 0.50–1.10)
GLUCOSE: 79 mg/dL (ref 70–99)
Potassium: 3.9 mEq/L (ref 3.5–5.3)
Sodium: 138 mEq/L (ref 135–145)
Total Protein: 6.4 g/dL (ref 6.0–8.3)

## 2014-01-07 LAB — CBC WITH DIFFERENTIAL/PLATELET
BASOS ABS: 0 10*3/uL (ref 0.0–0.1)
BASOS PCT: 1 % (ref 0–1)
EOS PCT: 2 % (ref 0–5)
Eosinophils Absolute: 0.1 10*3/uL (ref 0.0–0.7)
HEMATOCRIT: 37.2 % (ref 36.0–46.0)
HEMOGLOBIN: 12.3 g/dL (ref 12.0–15.0)
LYMPHS PCT: 48 % — AB (ref 12–46)
Lymphs Abs: 1.7 10*3/uL (ref 0.7–4.0)
MCH: 27.5 pg (ref 26.0–34.0)
MCHC: 33.1 g/dL (ref 30.0–36.0)
MCV: 83.2 fL (ref 78.0–100.0)
MONO ABS: 0.1 10*3/uL (ref 0.1–1.0)
Monocytes Relative: 4 % (ref 3–12)
Neutro Abs: 1.6 10*3/uL — ABNORMAL LOW (ref 1.7–7.7)
Neutrophils Relative %: 45 % (ref 43–77)
Platelets: 257 10*3/uL (ref 150–400)
RBC: 4.47 MIL/uL (ref 3.87–5.11)
RDW: 14.8 % (ref 11.5–15.5)
WBC: 3.6 10*3/uL — AB (ref 4.0–10.5)

## 2014-01-07 LAB — TSH: TSH: 0.667 u[IU]/mL (ref 0.350–4.500)

## 2014-01-08 LAB — URINALYSIS W MICROSCOPIC + REFLEX CULTURE
BILIRUBIN URINE: NEGATIVE
Bacteria, UA: NONE SEEN
Casts: NONE SEEN
Crystals: NONE SEEN
GLUCOSE, UA: NEGATIVE mg/dL
HGB URINE DIPSTICK: NEGATIVE
KETONES UR: NEGATIVE mg/dL
LEUKOCYTES UA: NEGATIVE
Nitrite: NEGATIVE
PH: 6.5 (ref 5.0–8.0)
Protein, ur: NEGATIVE mg/dL
SQUAMOUS EPITHELIAL / LPF: NONE SEEN
Specific Gravity, Urine: 1.021 (ref 1.005–1.030)
Urobilinogen, UA: 0.2 mg/dL (ref 0.0–1.0)

## 2014-03-08 ENCOUNTER — Encounter: Payer: Self-pay | Admitting: Gynecology

## 2014-03-08 ENCOUNTER — Ambulatory Visit (INDEPENDENT_AMBULATORY_CARE_PROVIDER_SITE_OTHER): Payer: BC Managed Care – PPO | Admitting: Gynecology

## 2014-03-08 VITALS — BP 126/78

## 2014-03-08 DIAGNOSIS — Z113 Encounter for screening for infections with a predominantly sexual mode of transmission: Secondary | ICD-10-CM

## 2014-03-08 DIAGNOSIS — B9689 Other specified bacterial agents as the cause of diseases classified elsewhere: Secondary | ICD-10-CM

## 2014-03-08 DIAGNOSIS — R350 Frequency of micturition: Secondary | ICD-10-CM

## 2014-03-08 DIAGNOSIS — A499 Bacterial infection, unspecified: Secondary | ICD-10-CM

## 2014-03-08 DIAGNOSIS — N760 Acute vaginitis: Secondary | ICD-10-CM

## 2014-03-08 LAB — URINALYSIS W MICROSCOPIC + REFLEX CULTURE
BILIRUBIN URINE: NEGATIVE
Casts: NONE SEEN
Crystals: NONE SEEN
Glucose, UA: NEGATIVE mg/dL
Ketones, ur: NEGATIVE mg/dL
LEUKOCYTES UA: NEGATIVE
Nitrite: NEGATIVE
Protein, ur: NEGATIVE mg/dL
Urobilinogen, UA: 0.2 mg/dL (ref 0.0–1.0)
WBC UA: NONE SEEN WBC/hpf (ref <3)
pH: 6 (ref 5.0–8.0)

## 2014-03-08 LAB — WET PREP FOR TRICH, YEAST, CLUE
Trich, Wet Prep: NONE SEEN
WBC WET PREP: NONE SEEN
YEAST WET PREP: NONE SEEN

## 2014-03-08 MED ORDER — TINIDAZOLE 500 MG PO TABS: 500.0000 mg | ORAL_TABLET | Freq: Once | ORAL | Status: DC

## 2014-03-08 NOTE — Patient Instructions (Signed)
Tinidazole tablets What is this medicine? TINIDAZOLE (tye NI da zole) is an antiinfective. It is used to treat amebiasis, giardiasis, trichomoniasis, and vaginosis. It will not work for colds, flu, or other viral infections. This medicine may be used for other purposes; ask your health care provider or pharmacist if you have questions. COMMON BRAND NAME(S): Tindamax What should I tell my health care provider before I take this medicine? They need to know if you have any of these conditions: -anemia or other blood disorders -if you frequently drink alcohol containing drinks -receiving hemodialysis -seizure disorder -an unusual or allergic reaction to tinidazole, other medicines, foods, dyes, or preservatives -pregnant or trying to get pregnant -breast-feeding How should I use this medicine? Take this medicine by mouth with a full glass of water. Follow the directions on the prescription label. Take with food. Take your medicine at regular intervals. Do not take your medicine more often than directed. Take all of your medicine as directed even if you think you are better. Do not skip doses or stop your medicine early. Talk to your pediatrician regarding the use of this medicine in children. While this drug may be prescribed for children as young as 3 years of age for selected conditions, precautions do apply. Overdosage: If you think you have taken too much of this medicine contact a poison control center or emergency room at once. NOTE: This medicine is only for you. Do not share this medicine with others. What if I miss a dose? If you miss a dose, take it as soon as you can. If it is almost time for your next dose, take only that dose. Do not take double or extra doses. What may interact with this medicine? Do not take this medicine with any of the following medications: -alcohol or any product that contains alcohol -amprenavir oral solution -disulfiram -paclitaxel injection -ritonavir  oral solution -sertraline oral solution -sulfamethoxazole-trimethoprim injection This medicine may also interact with the following medications: -cholestyramine -cimetidine -conivaptan -cyclosporin -fluorouracil -fosphenytoin, phenytoin -ketoconazole -lithium -phenobarbital -tacrolimus -warfarin This list may not describe all possible interactions. Give your health care provider a list of all the medicines, herbs, non-prescription drugs, or dietary supplements you use. Also tell them if you smoke, drink alcohol, or use illegal drugs. Some items may interact with your medicine. What should I watch for while using this medicine? Tell your doctor or health care professional if your symptoms do not improve or if they get worse. Avoid alcoholic drinks while you are taking this medicine and for three days afterward. Alcohol may make you feel dizzy, sick, or flushed. If you are being treated for a sexually transmitted disease, avoid sexual contact until you have finished your treatment. Your sexual partner may also need treatment. What side effects may I notice from receiving this medicine? Side effects that you should report to your doctor or health care professional as soon as possible: -allergic reactions like skin rash, itching or hives, swelling of the face, lips, or tongue -breathing problems -confusion, depression -dark or white patches in the mouth -feeling faint or lightheaded, falls -fever, infection -numbness, tingling, pain or weakness in the hands or feet -pain when passing urine -seizures -unusually weak or tired -vaginal irritation or discharge -vomiting Side effects that usually do not require medical attention (report to your doctor or health care professional if they continue or are bothersome): -dark brown or reddish urine -diarrhea -headache -loss of appetite -metallic taste -nausea -stomach upset This list may not describe all   possible side effects. Call your  doctor for medical advice about side effects. You may report side effects to FDA at 1-800-FDA-1088. Where should I keep my medicine? Keep out of the reach of children. Store at room temperature between 15 and 30 degrees C (59 and 86 degrees F). Protect from light and moisture. Keep container tightly closed. Throw away any unused medicine after the expiration date. NOTE: This sheet is a summary. It may not cover all possible information. If you have questions about this medicine, talk to your doctor, pharmacist, or health care provider.  2015, Elsevier/Gold Standard. (2007-11-17 15:22:28) Bacterial Vaginosis Bacterial vaginosis is a vaginal infection that occurs when the normal balance of bacteria in the vagina is disrupted. It results from an overgrowth of certain bacteria. This is the most common vaginal infection in women of childbearing age. Treatment is important to prevent complications, especially in pregnant women, as it can cause a premature delivery. CAUSES  Bacterial vaginosis is caused by an increase in harmful bacteria that are normally present in smaller amounts in the vagina. Several different kinds of bacteria can cause bacterial vaginosis. However, the reason that the condition develops is not fully understood. RISK FACTORS Certain activities or behaviors can put you at an increased risk of developing bacterial vaginosis, including:  Having a new sex partner or multiple sex partners.  Douching.  Using an intrauterine device (IUD) for contraception. Women do not get bacterial vaginosis from toilet seats, bedding, swimming pools, or contact with objects around them. SIGNS AND SYMPTOMS  Some women with bacterial vaginosis have no signs or symptoms. Common symptoms include:  Grey vaginal discharge.  A fishlike odor with discharge, especially after sexual intercourse.  Itching or burning of the vagina and vulva.  Burning or pain with urination. DIAGNOSIS  Your health care  provider will take a medical history and examine the vagina for signs of bacterial vaginosis. A sample of vaginal fluid may be taken. Your health care provider will look at this sample under a microscope to check for bacteria and abnormal cells. A vaginal pH test may also be done.  TREATMENT  Bacterial vaginosis may be treated with antibiotic medicines. These may be given in the form of a pill or a vaginal cream. A second round of antibiotics may be prescribed if the condition comes back after treatment.  HOME CARE INSTRUCTIONS   Only take over-the-counter or prescription medicines as directed by your health care provider.  If antibiotic medicine was prescribed, take it as directed. Make sure you finish it even if you start to feel better.  Do not have sex until treatment is completed.  Tell all sexual partners that you have a vaginal infection. They should see their health care provider and be treated if they have problems, such as a mild rash or itching.  Practice safe sex by using condoms and only having one sex partner. SEEK MEDICAL CARE IF:   Your symptoms are not improving after 3 days of treatment.  You have increased discharge or pain.  You have a fever. MAKE SURE YOU:   Understand these instructions.  Will watch your condition.  Will get help right away if you are not doing well or get worse. FOR MORE INFORMATION  Centers for Disease Control and Prevention, Division of STD Prevention: www.cdc.gov/std American Sexual Health Association (ASHA): www.ashastd.org  Document Released: 02/19/2005 Document Revised: 12/10/2012 Document Reviewed: 10/01/2012 ExitCare Patient Information 2015 [employer]. This information is not intended to replace advice given to you   by your health care provider. Make sure you discuss any questions you have with your health care provider.  

## 2014-03-08 NOTE — Progress Notes (Signed)
   Patient presented to the office today stating that her partner was treated recently what she believes was a urinary tract infection but not certain and wanted to have an STD screen today she had some mild urinary frequency no other symptoms.  Exam: Bartholin urethra Skene was within normal limits Vagina: No lesions or discharge Cervix: No lesions or discharge  GC and Chlamydia culture obtained   Urinalysis: Negative  Wet prep obtained: Positive amine, few clue cell, too numerous to count bacteria  Assessment/plan: Patient requesting STD screening. GC and Chlamydia culture was obtained. Patient will stop by the lab we will check an HIV, RPR, hepatitis B and C to complete the screening. For her bacterial vaginosis she'll be prescribed Tindamax 500 mg tablet she is to take 4 times today for talus tomorrow.

## 2014-03-09 LAB — HEPATITIS C ANTIBODY: HCV Ab: NEGATIVE

## 2014-03-09 LAB — HIV ANTIBODY (ROUTINE TESTING W REFLEX): HIV 1&2 Ab, 4th Generation: NONREACTIVE

## 2014-03-09 LAB — HEPATITIS B SURFACE ANTIGEN: Hepatitis B Surface Ag: NEGATIVE

## 2014-03-09 LAB — RPR

## 2014-03-10 LAB — GC/CHLAMYDIA PROBE AMP
CT PROBE, AMP APTIMA: NEGATIVE
GC Probe RNA: NEGATIVE

## 2014-06-07 ENCOUNTER — Telehealth: Payer: Self-pay | Admitting: *Deleted

## 2014-06-07 NOTE — Telephone Encounter (Signed)
Pt left message in triage with questions about birth control pills.

## 2014-06-07 NOTE — Telephone Encounter (Signed)
Pt called with questions if she was taking her birth control pills correct 3 packs 1st and 2nd pack take 21 pills only and 3rd pack take all 28 pills. Pt is still doing this correct.

## 2014-12-28 ENCOUNTER — Encounter: Payer: Self-pay | Admitting: Gynecology

## 2014-12-28 ENCOUNTER — Ambulatory Visit (INDEPENDENT_AMBULATORY_CARE_PROVIDER_SITE_OTHER): Payer: BLUE CROSS/BLUE SHIELD | Admitting: Gynecology

## 2014-12-28 VITALS — BP 112/70 | Ht 59.5 in | Wt 115.0 lb

## 2014-12-28 DIAGNOSIS — Z01419 Encounter for gynecological examination (general) (routine) without abnormal findings: Secondary | ICD-10-CM | POA: Diagnosis not present

## 2014-12-28 MED ORDER — LEVONORGESTREL-ETHINYL ESTRAD 0.1-20 MG-MCG PO TABS: ORAL_TABLET | ORAL | Status: DC

## 2014-12-28 NOTE — Progress Notes (Signed)
Heather Anderson 02/08/1976 [id number]   History:    39 y.o.  for annual gyn exam with no complaints today. She is on a continuous oral contraceptive pill regimen whereby she withdrawals every 3 months and has done well. Patient with no past history of abnormal Pap smears. Patient declined flu vaccine today.  Past medical history,surgical history, family history and social history were all reviewed and documented in the EPIC chart.  Gynecologic History Patient's last menstrual period was 12/24/2014. Contraception: OCP (estrogen/progesterone) Last Pap: 2015. Results were: normal Last mammogram: Not indicated. Results were: Not indicated  Obstetric History OB History  Gravida Para Term Preterm AB SAB TAB Ectopic Multiple Living  3 1 1  2 2    1     # Outcome Date GA Lbr Len/2nd Weight Sex Delivery Anes PTL Lv  3 SAB           2 SAB           1 Term     M Vag-Spont  N Y       ROS: A ROS was performed and pertinent positives and negatives are included in the history.  GENERAL: No fevers or chills. HEENT: No change in vision, no earache, sore throat or sinus congestion. NECK: No pain or stiffness. CARDIOVASCULAR: No chest pain or pressure. No palpitations. PULMONARY: No shortness of breath, cough or wheeze. GASTROINTESTINAL: No abdominal pain, nausea, vomiting or diarrhea, melena or bright red blood per rectum. GENITOURINARY: No urinary frequency, urgency, hesitancy or dysuria. MUSCULOSKELETAL: No joint or muscle pain, no back pain, no recent trauma. DERMATOLOGIC: No rash, no itching, no lesions. ENDOCRINE: No polyuria, polydipsia, no heat or cold intolerance. No recent change in weight. HEMATOLOGICAL: No anemia or easy bruising or bleeding. NEUROLOGIC: No headache, seizures, numbness, tingling or weakness. PSYCHIATRIC: No depression, no loss of interest in normal activity or change in sleep pattern.     Exam: chaperone present  BP 112/70 mmHg  Ht 4' 11.5" (1.511 m)  Wt 115 lb (52.164  kg)  BMI 22.85 kg/m2  LMP 12/24/2014  Body mass index is 22.85 kg/(m^2).  General appearance : Well developed well nourished female. No acute distress HEENT: Eyes: no retinal hemorrhage or exudates,  Neck supple, trachea midline, no carotid bruits, no thyroidmegaly Lungs: Clear to auscultation, no rhonchi or wheezes, or rib retractions  Heart: Regular rate and rhythm, no murmurs or gallops Breast:Examined in sitting and supine position were symmetrical in appearance, no palpable masses or tenderness,  no skin retraction, no nipple inversion, no nipple discharge, no skin discoloration, no axillary or supraclavicular lymphadenopathy Abdomen: no palpable masses or tenderness, no rebound or guarding Extremities: no edema or skin discoloration or tenderness  Pelvic:  Bartholin, Urethra, Skene Glands: Within normal limits             Vagina: No gross lesions or discharge  Cervix: No gross lesions or discharge  Uterus  anteverted, normal size, shape and consistency, non-tender and mobile  Adnexa  Without masses or tenderness  Anus and perineum  normal   Rectovaginal  normal sphincter tone without palpated masses or tenderness             Hemoccult not indicated     Assessment/Plan:  39 y.o. female for annual exam will return to the office later next week in a fasting state for the following screening blood work: Comprehensive metabolic panel, fasting lipid profile, TSH, CBC, and urinalysis. Next year patient will need a baseline  mammogram. We discussed importance of monthly breast exam. Patient declined flu vaccine today. Pap smear not indicated today.   Ok Edwards MD, 4:40 PM 12/28/2014

## 2014-12-29 ENCOUNTER — Other Ambulatory Visit: Payer: Self-pay | Admitting: Gynecology

## 2014-12-29 DIAGNOSIS — R3129 Other microscopic hematuria: Secondary | ICD-10-CM

## 2014-12-29 LAB — URINALYSIS W MICROSCOPIC + REFLEX CULTURE
BILIRUBIN URINE: NEGATIVE
Bacteria, UA: NONE SEEN [HPF]
Casts: NONE SEEN [LPF]
Crystals: NONE SEEN [HPF]
Glucose, UA: NEGATIVE
KETONES UR: NEGATIVE
LEUKOCYTES UA: NEGATIVE
NITRITE: NEGATIVE
PH: 7 (ref 5.0–8.0)
Protein, ur: NEGATIVE
RBC / HPF: NONE SEEN RBC/HPF (ref <=2)
SPECIFIC GRAVITY, URINE: 1.011 (ref 1.001–1.035)
Yeast: NONE SEEN [HPF]

## 2014-12-31 ENCOUNTER — Other Ambulatory Visit: Payer: BLUE CROSS/BLUE SHIELD

## 2015-01-01 ENCOUNTER — Other Ambulatory Visit: Payer: Self-pay | Admitting: Gynecology

## 2015-01-01 LAB — URINE CULTURE

## 2015-01-03 ENCOUNTER — Other Ambulatory Visit: Payer: Self-pay | Admitting: *Deleted

## 2015-01-03 MED ORDER — NITROFURANTOIN MONOHYD MACRO 100 MG PO CAPS: 100.0000 mg | ORAL_CAPSULE | Freq: Two times a day (BID) | ORAL | Status: DC

## 2015-01-07 ENCOUNTER — Other Ambulatory Visit: Payer: BLUE CROSS/BLUE SHIELD

## 2015-01-07 LAB — CBC WITH DIFFERENTIAL/PLATELET
BASOS PCT: 1 % (ref 0–1)
Basophils Absolute: 0 10*3/uL (ref 0.0–0.1)
EOS ABS: 0.1 10*3/uL (ref 0.0–0.7)
Eosinophils Relative: 2 % (ref 0–5)
HCT: 39.7 % (ref 36.0–46.0)
HEMOGLOBIN: 12.9 g/dL (ref 12.0–15.0)
Lymphocytes Relative: 30 % (ref 12–46)
Lymphs Abs: 1.3 10*3/uL (ref 0.7–4.0)
MCH: 27.7 pg (ref 26.0–34.0)
MCHC: 32.5 g/dL (ref 30.0–36.0)
MCV: 85.2 fL (ref 78.0–100.0)
MONO ABS: 0.3 10*3/uL (ref 0.1–1.0)
MPV: 11.2 fL (ref 8.6–12.4)
Monocytes Relative: 7 % (ref 3–12)
NEUTROS ABS: 2.6 10*3/uL (ref 1.7–7.7)
NEUTROS PCT: 60 % (ref 43–77)
Platelets: 222 10*3/uL (ref 150–400)
RBC: 4.66 MIL/uL (ref 3.87–5.11)
RDW: 14 % (ref 11.5–15.5)
WBC: 4.3 10*3/uL (ref 4.0–10.5)

## 2015-01-07 LAB — LIPID PANEL
CHOLESTEROL: 136 mg/dL (ref 125–200)
HDL: 58 mg/dL (ref >=46)
LDL Cholesterol: 68 mg/dL (ref <130)
TRIGLYCERIDES: 50 mg/dL (ref <150)
Total CHOL/HDL Ratio: 2.3 Ratio (ref <=5.0)
VLDL: 10 mg/dL (ref <30)

## 2015-01-07 LAB — COMPREHENSIVE METABOLIC PANEL
ALBUMIN: 3.8 g/dL (ref 3.6–5.1)
ALT: 9 U/L (ref 6–29)
AST: 12 U/L (ref 10–30)
Alkaline Phosphatase: 37 U/L (ref 33–115)
BILIRUBIN TOTAL: 0.4 mg/dL (ref 0.2–1.2)
BUN: 10 mg/dL (ref 7–25)
CO2: 23 mmol/L (ref 20–31)
Calcium: 8.5 mg/dL — ABNORMAL LOW (ref 8.6–10.2)
Chloride: 103 mmol/L (ref 98–110)
Creat: 0.78 mg/dL (ref 0.50–1.10)
Glucose, Bld: 80 mg/dL (ref 65–99)
Potassium: 3.9 mmol/L (ref 3.5–5.3)
SODIUM: 138 mmol/L (ref 135–146)
Total Protein: 6.5 g/dL (ref 6.1–8.1)

## 2015-01-07 LAB — TSH: TSH: 0.743 u[IU]/mL (ref 0.350–4.500)

## 2015-01-10 ENCOUNTER — Other Ambulatory Visit: Payer: Self-pay | Admitting: Gynecology

## 2015-01-10 DIAGNOSIS — E58 Dietary calcium deficiency: Secondary | ICD-10-CM

## 2015-01-31 ENCOUNTER — Encounter: Payer: Self-pay | Admitting: Women's Health

## 2015-01-31 ENCOUNTER — Ambulatory Visit (INDEPENDENT_AMBULATORY_CARE_PROVIDER_SITE_OTHER): Payer: BLUE CROSS/BLUE SHIELD | Admitting: Women's Health

## 2015-01-31 VITALS — BP 122/80 | Ht 59.0 in | Wt 115.0 lb

## 2015-01-31 DIAGNOSIS — N951 Menopausal and female climacteric states: Secondary | ICD-10-CM | POA: Diagnosis not present

## 2015-01-31 DIAGNOSIS — R350 Frequency of micturition: Secondary | ICD-10-CM

## 2015-01-31 DIAGNOSIS — A499 Bacterial infection, unspecified: Secondary | ICD-10-CM

## 2015-01-31 DIAGNOSIS — A599 Trichomoniasis, unspecified: Secondary | ICD-10-CM | POA: Diagnosis not present

## 2015-01-31 DIAGNOSIS — N760 Acute vaginitis: Secondary | ICD-10-CM | POA: Diagnosis not present

## 2015-01-31 DIAGNOSIS — Z113 Encounter for screening for infections with a predominantly sexual mode of transmission: Secondary | ICD-10-CM

## 2015-01-31 DIAGNOSIS — B9689 Other specified bacterial agents as the cause of diseases classified elsewhere: Secondary | ICD-10-CM

## 2015-01-31 LAB — URINALYSIS W MICROSCOPIC + REFLEX CULTURE
BILIRUBIN URINE: NEGATIVE
CRYSTALS: NONE SEEN [HPF]
Casts: NONE SEEN [LPF]
Glucose, UA: NEGATIVE
Nitrite: NEGATIVE
PROTEIN: NEGATIVE
Specific Gravity, Urine: >1.03 (ref 1.001–1.035)
Yeast: NONE SEEN [HPF]
pH: 6 (ref 5.0–8.0)

## 2015-01-31 LAB — WET PREP FOR TRICH, YEAST, CLUE
CLUE CELLS WET PREP: NONE SEEN
Yeast Wet Prep HPF POC: NONE SEEN

## 2015-01-31 MED ORDER — METRONIDAZOLE 500 MG PO TABS: ORAL_TABLET | ORAL | Status: DC

## 2015-01-31 MED ORDER — METRONIDAZOLE 0.75 % VA GEL: VAGINAL | Status: DC

## 2015-01-31 MED ORDER — VENLAFAXINE HCL ER 37.5 MG PO CP24
37.5000 mg | ORAL_CAPSULE | Freq: Every day | ORAL | Status: DC
Start: 2015-01-31 — End: 2016-05-04

## 2015-01-31 NOTE — Patient Instructions (Addendum)
Menopause Menopause is the normal time of life when menstrual periods stop completely. Menopause is complete when you have missed 12 consecutive menstrual periods. It usually occurs between the ages of 48 years and 55 years. Very rarely does a woman develop menopause before the age of 40 years. At menopause, your ovaries stop producing the female hormones estrogen and progesterone. This can cause undesirable symptoms and also affect your health. Sometimes the symptoms may occur 4-5 years before the menopause begins. There is no relationship between menopause and:  Oral contraceptives.  Number of children you had.  Race.  The age your menstrual periods started (menarche). Heavy smokers and very thin women may develop menopause earlier in life. CAUSES  The ovaries stop producing the female hormones estrogen and progesterone.  Other causes include:  Surgery to remove both ovaries.  The ovaries stop functioning for no known reason.  Tumors of the pituitary gland in the brain.  Medical disease that affects the ovaries and hormone production.  Radiation treatment to the abdomen or pelvis.  Chemotherapy that affects the ovaries. SYMPTOMS   Hot flashes.  Night sweats.  Decrease in sex drive.  Vaginal dryness and thinning of the vagina causing painful intercourse.  Dryness of the skin and developing wrinkles.  Headaches.  Tiredness.  Irritability.  Memory problems.  Weight gain.  Bladder infections.  Hair growth of the face and chest.  Infertility. More serious symptoms include:  Loss of bone (osteoporosis) causing breaks (fractures).  Depression.  Hardening and narrowing of the arteries (atherosclerosis) causing heart attacks and strokes. DIAGNOSIS   When the menstrual periods have stopped for 12 straight months.  Physical exam.  Hormone studies of the blood. TREATMENT  There are many treatment choices and nearly as many questions about them. The  decisions to treat or not to treat menopausal changes is an individual choice made with your health care provider. Your health care provider can discuss the treatments with you. Together, you can decide which treatment will work best for you. Your treatment choices may include:   Hormone therapy (estrogen and progesterone).  Non-hormonal medicines.  Treating the individual symptoms with medicine (for example antidepressants for depression).  Herbal medicines that may help specific symptoms.  Counseling by a psychiatrist or psychologist.  Group therapy.  Lifestyle changes including:  Eating healthy.  Regular exercise.  Limiting caffeine and alcohol.  Stress management and meditation.  No treatment. HOME CARE INSTRUCTIONS   Take the medicine your health care provider gives you as directed.  Get plenty of sleep and rest.  Exercise regularly.  Eat a diet that contains calcium (good for the bones) and soy products (acts like estrogen hormone).  Avoid alcoholic beverages.  Do not smoke.  If you have hot flashes, dress in layers.  Take supplements, calcium, and vitamin D to strengthen bones.  You can use over-the-counter lubricants or moisturizers for vaginal dryness.  Group therapy is sometimes very helpful.  Acupuncture may be helpful in some cases. SEEK MEDICAL CARE IF:   You are not sure you are in menopause.  You are having menopausal symptoms and need advice and treatment.  You are still having menstrual periods after age 55 years.  You have pain with intercourse.  Menopause is complete (no menstrual period for 12 months) and you develop vaginal bleeding.  You need a referral to a specialist (gynecologist, psychiatrist, or psychologist) for treatment. SEEK IMMEDIATE MEDICAL CARE IF:   You have severe depression.  You have excessive vaginal bleeding.    You fell and think you have a broken bone.  You have pain when you urinate.  You develop leg or  chest pain.  You have a fast pounding heart beat (palpitations).  You have severe headaches.  You develop vision problems.  You feel a lump in your breast.  You have abdominal pain or severe indigestion.   This information is not intended to replace advice given to you by your health care provider. Make sure you discuss any questions you have with your health care provider.   Document Released: 05/12/2003 Document Revised: 10/22/2012 Document Reviewed: 09/18/2012 Elsevier Interactive Patient Education 2016 [employer] Bacterial Vaginosis Bacterial vaginosis is a vaginal infection that occurs when the normal balance of bacteria in the vagina is disrupted. It results from an overgrowth of certain bacteria. This is the most common vaginal infection in women of childbearing age. Treatment is important to prevent complications, especially in pregnant women, as it can cause a premature delivery. CAUSES  Bacterial vaginosis is caused by an increase in harmful bacteria that are normally present in smaller amounts in the vagina. Several different kinds of bacteria can cause bacterial vaginosis. However, the reason that the condition develops is not fully understood. RISK FACTORS Certain activities or behaviors can put you at an increased risk of developing bacterial vaginosis, including:  Having a new sex partner or multiple sex partners.  Douching.  Using an intrauterine device (IUD) for contraception. Women do not get bacterial vaginosis from toilet seats, bedding, swimming pools, or contact with objects around them. SIGNS AND SYMPTOMS  Some women with bacterial vaginosis have no signs or symptoms. Common symptoms include:  Grey vaginal discharge.  A fishlike odor with discharge, especially after sexual intercourse.  Itching or burning of the vagina and vulva.  Burning or pain with urination. DIAGNOSIS  Your health care provider will take a medical history and examine the vagina  for signs of bacterial vaginosis. A sample of vaginal fluid may be taken. Your health care provider will look at this sample under a microscope to check for bacteria and abnormal cells. A vaginal pH test may also be done.  TREATMENT  Bacterial vaginosis may be treated with antibiotic medicines. These may be given in the form of a pill or a vaginal cream. A second round of antibiotics may be prescribed if the condition comes back after treatment. Because bacterial vaginosis increases your risk for sexually transmitted diseases, getting treated can help reduce your risk for chlamydia, gonorrhea, HIV, and herpes. HOME CARE INSTRUCTIONS   Only take over-the-counter or prescription medicines as directed by your health care provider.  If antibiotic medicine was prescribed, take it as directed. Make sure you finish it even if you start to feel better.  Tell all sexual partners that you have a vaginal infection. They should see their health care provider and be treated if they have problems, such as a mild rash or itching.  During treatment, it is important that you follow these instructions:  Avoid sexual activity or use condoms correctly.  Do not douche.  Avoid alcohol as directed by your health care provider.  Avoid breastfeeding as directed by your health care provider. SEEK MEDICAL CARE IF:   Your symptoms are not improving after 3 days of treatment.  You have increased discharge or pain.  You have a fever. MAKE SURE YOU:   Understand these instructions.  Will watch your condition.  Will get help right away if you are not doing well or get worse.  FOR MORE INFORMATION  Centers for Disease Control and Prevention, Division of STD Prevention: [employer] American Sexual Health Association (ASHA): www.ashastd.org    This information is not intended to replace advice given to you by your health care provider. Make sure you discuss any questions you have with your health care  provider.   Document Released: 02/19/2005 Document Revised: 03/12/2014 Document Reviewed: 10/01/2012 Elsevier Interactive Patient Education 2016 [employer]. Trichomoniasis Trichomoniasis is an infection caused by an organism called Trichomonas. The infection can affect both women and men. In women, the outer female genitalia and the vagina are affected. In men, the penis is mainly affected, but the prostate and other reproductive organs can also be involved. Trichomoniasis is a sexually transmitted infection (STI) and is most often passed to another person through sexual contact.  RISK FACTORS  Having unprotected sexual intercourse.  Having sexual intercourse with an infected partner. SIGNS AND SYMPTOMS  Symptoms of trichomoniasis in women include:  Abnormal gray-green frothy vaginal discharge.  Itching and irritation of the vagina.  Itching and irritation of the area outside the vagina. Symptoms of trichomoniasis in men include:   Penile discharge with or without pain.  Pain during urination. This results from inflammation of the urethra. DIAGNOSIS  Trichomoniasis may be found during a Pap test or physical exam. Your health care provider may use one of the following methods to help diagnose this infection:  Testing the pH of the vagina with a test tape.  Using a vaginal swab test that checks for the Trichomonas organism. A test is available that provides results within a few minutes.  Examining a urine sample.  Testing vaginal secretions. Your health care provider may test you for other STIs, including HIV. TREATMENT   You may be given medicine to fight the infection. Women should inform their health care provider if they could be or are pregnant. Some medicines used to treat the infection should not be taken during pregnancy.  Your health care provider may recommend over-the-counter medicines or creams to decrease itching or irritation.  Your sexual partner will need to  be treated if infected.  Your health care provider may test you for infection again 3 months after treatment. HOME CARE INSTRUCTIONS   Take medicines only as directed by your health care provider.  Take over-the-counter medicine for itching or irritation as directed by your health care provider.  Do not have sexual intercourse while you have the infection.  Women should not douche or wear tampons while they have the infection.  Discuss your infection with your partner. Your partner may have gotten the infection from you, or you may have gotten it from your partner.  Have your sex partner get examined and treated if necessary.  Practice safe, informed, and protected sex.  See your health care provider for other STI testing. SEEK MEDICAL CARE IF:   You still have symptoms after you finish your medicine.  You develop abdominal pain.  You have pain when you urinate.  You have bleeding after sexual intercourse.  You develop a rash.  Your medicine makes you sick or makes you throw up (vomit). MAKE SURE YOU:  Understand these instructions.  Will watch your condition.  Will get help right away if you are not doing well or get worse.   This information is not intended to replace advice given to you by your health care provider. Make sure you discuss any questions you have with your health care provider.   Document Released: 08/15/2000 Document  Revised: 03/12/2014 Document Reviewed: 12/01/2012 Elsevier Interactive Patient Education [employer].

## 2015-01-31 NOTE — Progress Notes (Signed)
Patient ID: Heather Anderson, female   DOB: 1975-12-18, 39 y.o.   MRN: [id number] Presents with several issues. Increased vaginal  discharge with occasional odor and irritation, menopausal symptoms of moodiness especially before cycle, short tempered, hot flashes. 39-year-old son who is doing well but becomes short tempered with him. States mother and maternal grandmother early 11040s with menopause. New partner. Cycles every 3 months on continuous OCs. Normal TSH 12/2014.  Exam: Appears well. External genitalia erythematous at introitus, speculum exam moderate amount of milky discharge noted wet prep positive for amines, Trichomonas,  many bacteria. GC/Chlamydia culture taken. Bimanual no CMT or adnexal tenderness.  Trichomonas STD screen PMS/Moodiness  Plan: Flagyl 2 g by mouth for both she and partner. Instructed to call if no relief of symptoms, alcohol precautions reviewed. GC/Chlamydia culture pending, HIV, hep B, C, RPR. FSH. Reviewed most likely not menopausal. Encouraged counseling. Would like to try medication, will try Effexor 37.5 reviewed low dose, reviewed importance of leisure activities, relaxation, exercise to help with mood changes.

## 2015-02-01 LAB — HIV ANTIBODY (ROUTINE TESTING W REFLEX): HIV 1&2 Ab, 4th Generation: NONREACTIVE

## 2015-02-01 LAB — GC/CHLAMYDIA PROBE AMP
CT PROBE, AMP APTIMA: NEGATIVE
GC PROBE AMP APTIMA: NEGATIVE

## 2015-02-01 LAB — FOLLICLE STIMULATING HORMONE: FSH: 2.6 m[IU]/mL

## 2015-02-01 LAB — HEPATITIS C ANTIBODY: HCV Ab: NEGATIVE

## 2015-02-01 LAB — RPR

## 2015-02-01 LAB — HEPATITIS B SURFACE ANTIGEN: HEP B S AG: NEGATIVE

## 2015-02-02 LAB — URINE CULTURE: Colony Count: 10000

## 2015-09-01 DIAGNOSIS — I87393 Chronic venous hypertension (idiopathic) with other complications of bilateral lower extremity: Secondary | ICD-10-CM | POA: Diagnosis not present

## 2015-10-01 ENCOUNTER — Other Ambulatory Visit: Payer: Self-pay | Admitting: Gynecology

## 2015-12-19 ENCOUNTER — Other Ambulatory Visit: Payer: Self-pay | Admitting: Gynecology

## 2015-12-29 ENCOUNTER — Ambulatory Visit (INDEPENDENT_AMBULATORY_CARE_PROVIDER_SITE_OTHER): Payer: BLUE CROSS/BLUE SHIELD | Admitting: Gynecology

## 2015-12-29 ENCOUNTER — Encounter: Payer: Self-pay | Admitting: Gynecology

## 2015-12-29 VITALS — BP 128/86 | Ht 59.75 in | Wt 124.6 lb

## 2015-12-29 DIAGNOSIS — N898 Other specified noninflammatory disorders of vagina: Secondary | ICD-10-CM | POA: Diagnosis not present

## 2015-12-29 DIAGNOSIS — N760 Acute vaginitis: Secondary | ICD-10-CM

## 2015-12-29 DIAGNOSIS — A599 Trichomoniasis, unspecified: Secondary | ICD-10-CM | POA: Diagnosis not present

## 2015-12-29 DIAGNOSIS — Z23 Encounter for immunization: Secondary | ICD-10-CM

## 2015-12-29 DIAGNOSIS — B9689 Other specified bacterial agents as the cause of diseases classified elsewhere: Secondary | ICD-10-CM | POA: Diagnosis not present

## 2015-12-29 DIAGNOSIS — Z113 Encounter for screening for infections with a predominantly sexual mode of transmission: Secondary | ICD-10-CM

## 2015-12-29 DIAGNOSIS — Z01411 Encounter for gynecological examination (general) (routine) with abnormal findings: Secondary | ICD-10-CM | POA: Diagnosis not present

## 2015-12-29 LAB — WET PREP FOR TRICH, YEAST, CLUE: YEAST WET PREP: NONE SEEN

## 2015-12-29 MED ORDER — METRONIDAZOLE 500 MG PO TABS: 500.0000 mg | ORAL_TABLET | Freq: Two times a day (BID) | ORAL | 0 refills | Status: DC

## 2015-12-29 NOTE — Progress Notes (Signed)
Heather Anderson Dec 25, 1975 [id number]   History:    40 y.o.  for annual gyn exam with complaint of a clear fishy odor discharge. Patient reports normal menstrual cycles on the oral contraceptive pill. She states she is in a monogamous relationship. Last year she was tested for STD and was negative with the exception that she was treated for trichomoniasis.  Past medical history,surgical history, family history and social history were all reviewed and documented in the EPIC chart.  Gynecologic History Patient's last menstrual period was 12/20/2015. Contraception: oral progesterone-only contraceptive Last Pap: 2015. Results were: normal Last mammogram: No previous study. Results were: No previous study  Obstetric History OB History  Gravida Para Term Preterm AB Living  3 1 1   2 1   SAB TAB Ectopic Multiple Live Births  2       1    # Outcome Date GA Lbr Len/2nd Weight Sex Delivery Anes PTL Lv  3 SAB           2 SAB           1 Term     M Vag-Spont  N LIV       ROS: A ROS was performed and pertinent positives and negatives are included in the history.  GENERAL: No fevers or chills. HEENT: No change in vision, no earache, sore throat or sinus congestion. NECK: No pain or stiffness. CARDIOVASCULAR: No chest pain or pressure. No palpitations. PULMONARY: No shortness of breath, cough or wheeze. GASTROINTESTINAL: No abdominal pain, nausea, vomiting or diarrhea, melena or bright red blood per rectum. GENITOURINARY: No urinary frequency, urgency, hesitancy or dysuria. MUSCULOSKELETAL: No joint or muscle pain, no back pain, no recent trauma. DERMATOLOGIC: No rash, no itching, no lesions. ENDOCRINE: No polyuria, polydipsia, no heat or cold intolerance. No recent change in weight. HEMATOLOGICAL: No anemia or easy bruising or bleeding. NEUROLOGIC: No headache, seizures, numbness, tingling or weakness. PSYCHIATRIC: No depression, no loss of interest in normal activity or change in sleep pattern.      Exam: chaperone present  BP 128/86   Ht 4' 11.75" (1.518 m)   Wt 124 lb 9.6 oz (56.5 kg)   LMP 12/20/2015   BMI 24.54 kg/m   Body mass index is 24.54 kg/m.  General appearance : Well developed well nourished female. No acute distress HEENT: Eyes: no retinal hemorrhage or exudates,  Neck supple, trachea midline, no carotid bruits, no thyroidmegaly Lungs: Clear to auscultation, no rhonchi or wheezes, or rib retractions  Heart: Regular rate and rhythm, no murmurs or gallops Breast:Examined in sitting and supine position were symmetrical in appearance, no palpable masses or tenderness,  no skin retraction, no nipple inversion, no nipple discharge, no skin discoloration, no axillary or supraclavicular lymphadenopathy Abdomen: no palpable masses or tenderness, no rebound or guarding Extremities: no edema or skin discoloration or tenderness  Pelvic:  Bartholin, Urethra, Skene Glands: Within normal limits             Vagina: Clear fishy odor discharge  Cervix: No gross lesions or discharge  Uterus  anteverted, normal size, shape and consistency, non-tender and mobile  Adnexa  Without masses or tenderness  Anus and perineum  normal   Rectovaginal  normal sphincter tone without palpated masses or tenderness             Hemoccult not indicated   Wet prep moderate Trichomonas, moderate clue cells, too numerous to count white blood cell, too numerous to count bacteria  Assessment/Plan:  40 y.o. female for annual exam with clinical evidence of trichomoniasis and bacterial vaginosis will be treated with Flagyl 500 mg twice a day for 7 days. A GC and Chlamydia culture was obtained. Complete the STD screening she will have an HIV, RPR, hepatitis B and C as well. She'll return back to the office in 3 weeks for test of cure. She will also need a fasting lipid profile, comprehensive metabolic panel, CBC, TSH and urinalysis. She was informed that she needs to contact partner to see his primary  physician for treatment as well. She received the flu vaccine today. Literature information all the above was provided.   Ok EdwardsFERNANDEZ,Heather Laymon H MD, 11:43 AM 12/29/2015

## 2015-12-29 NOTE — Addendum Note (Signed)
Addended by: Berna SpareASTILLO, BLANCA A on: 12/29/2015 12:58 PM   Modules accepted: Orders

## 2015-12-29 NOTE — Addendum Note (Signed)
Addended by: Berna SpareASTILLO, Lavaughn Bisig A on: 12/29/2015 12:56 PM   Modules accepted: Orders

## 2015-12-29 NOTE — Patient Instructions (Addendum)
Influenza Virus Vaccine (Flucelvax) What is this medicine? INFLUENZA VIRUS VACCINE (in floo EN zuh VAHY ruhs vak SEEN) helps to reduce the risk of getting influenza also known as the flu. The vaccine only helps protect you against some strains of the flu. This medicine may be used for other purposes; ask your health care provider or pharmacist if you have questions. What should I tell my health care provider before I take this medicine? They need to know if you have any of these conditions: -bleeding disorder like hemophilia -fever or infection -Guillain-Barre syndrome or other neurological problems -immune system problems -infection with the human immunodeficiency virus (HIV) or AIDS -low blood platelet counts -multiple sclerosis -an unusual or allergic reaction to influenza virus vaccine, other medicines, foods, dyes or preservatives -pregnant or trying to get pregnant -breast-feeding How should I use this medicine? This vaccine is for injection into a muscle. It is given by a health care professional. A copy of Vaccine Information Statements will be given before each vaccination. Read this sheet carefully each time. The sheet may change frequently. Talk to your pediatrician regarding the use of this medicine in children. Special care may be needed. Overdosage: If you think you've taken too much of this medicine contact a poison control center or emergency room at once. Overdosage: If you think you have taken too much of this medicine contact a poison control center or emergency room at once. NOTE: This medicine is only for you. Do not share this medicine with others. What if I miss a dose? This does not apply. What may interact with this medicine? -chemotherapy or radiation therapy -medicines that lower your immune system like etanercept, anakinra, infliximab, and adalimumab -medicines that treat or prevent blood clots like warfarin -phenytoin -steroid medicines like prednisone or  cortisone -theophylline -vaccines This list may not describe all possible interactions. Give your health care provider a list of all the medicines, herbs, non-prescription drugs, or dietary supplements you use. Also tell them if you smoke, drink alcohol, or use illegal drugs. Some items may interact with your medicine. What should I watch for while using this medicine? Report any side effects that do not go away within 3 days to your doctor or health care professional. Call your health care provider if any unusual symptoms occur within 6 weeks of receiving this vaccine. You may still catch the flu, but the illness is not usually as bad. You cannot get the flu from the vaccine. The vaccine will not protect against colds or other illnesses that may cause fever. The vaccine is needed every year. What side effects may I notice from receiving this medicine? Side effects that you should report to your doctor or health care professional as soon as possible: -allergic reactions like skin rash, itching or hives, swelling of the face, lips, or tongue Side effects that usually do not require medical attention (Report these to your doctor or health care professional if they continue or are bothersome.): -fever -headache -muscle aches and pains -pain, tenderness, redness, or swelling at the injection site -tiredness This list may not describe all possible side effects. Call your doctor for medical advice about side effects. You may report side effects to FDA at 1-800-FDA-1088. Where should I keep my medicine? The vaccine will be given by a health care professional in a clinic, pharmacy, doctor's office, or other health care setting. You will not be given vaccine doses to store at home. NOTE: This sheet is a summary. It may not cover   all possible information. If you have questions about this medicine, talk to your doctor, pharmacist, or health care provider.    2016, Elsevier/Gold Standard. (2011-01-31  14:06:47) Metronidazole tablets or capsules What is this medicine? METRONIDAZOLE (me troe NI da zole) is an antiinfective. It is used to treat certain kinds of bacterial and protozoal infections. It will not work for colds, flu, or other viral infections. This medicine may be used for other purposes; ask your health care provider or pharmacist if you have questions. What should I tell my health care provider before I take this medicine? They need to know if you have any of these conditions: -anemia or other blood disorders -disease of the nervous system -fungal or yeast infection -if you drink alcohol containing drinks -liver disease -seizures -an unusual or allergic reaction to metronidazole, or other medicines, foods, dyes, or preservatives -pregnant or trying to get pregnant -breast-feeding How should I use this medicine? Take this medicine by mouth with a full glass of water. Follow the directions on the prescription label. Take your medicine at regular intervals. Do not take your medicine more often than directed. Take all of your medicine as directed even if you think you are better. Do not skip doses or stop your medicine early. Talk to your pediatrician regarding the use of this medicine in children. Special care may be needed. Overdosage: If you think you have taken too much of this medicine contact a poison control center or emergency room at once. NOTE: This medicine is only for you. Do not share this medicine with others. What if I miss a dose? If you miss a dose, take it as soon as you can. If it is almost time for your next dose, take only that dose. Do not take double or extra doses. What may interact with this medicine? Do not take this medicine with any of the following medications: -alcohol or any product that contains alcohol -amprenavir oral solution -cisapride -disulfiram -dofetilide -dronedarone -paclitaxel injection -pimozide -ritonavir oral  solution -sertraline oral solution -sulfamethoxazole-trimethoprim injection -thioridazine -ziprasidone This medicine may also interact with the following medications: -birth control pills -cimetidine -lithium -other medicines that prolong the QT interval (cause an abnormal heart rhythm) -phenobarbital -phenytoin -warfarin This list may not describe all possible interactions. Give your health care provider a list of all the medicines, herbs, non-prescription drugs, or dietary supplements you use. Also tell them if you smoke, drink alcohol, or use illegal drugs. Some items may interact with your medicine. What should I watch for while using this medicine? Tell your doctor or health care professional if your symptoms do not improve or if they get worse. You may get drowsy or dizzy. Do not drive, use machinery, or do anything that needs mental alertness until you know how this medicine affects you. Do not stand or sit up quickly, especially if you are an older patient. This reduces the risk of dizzy or fainting spells. Avoid alcoholic drinks while you are taking this medicine and for three days afterward. Alcohol may make you feel dizzy, sick, or flushed. If you are being treated for a sexually transmitted disease, avoid sexual contact until you have finished your treatment. Your sexual partner may also need treatment. What side effects may I notice from receiving this medicine? Side effects that you should report to your doctor or health care professional as soon as possible: -allergic reactions like skin rash or hives, swelling of the face, lips, or tongue -confusion, clumsiness -difficulty speaking -discolored or  sore mouth -dizziness -fever, infection -numbness, tingling, pain or weakness in the hands or feet -trouble passing urine or change in the amount of urine -redness, blistering, peeling or loosening of the skin, including inside the mouth -seizures -unusually weak or  tired -vaginal irritation, dryness, or discharge Side effects that usually do not require medical attention (report to your doctor or health care professional if they continue or are bothersome): -diarrhea -headache -irritability -metallic taste -nausea -stomach pain or cramps -trouble sleeping This list may not describe all possible side effects. Call your doctor for medical advice about side effects. You may report side effects to FDA at 1-800-FDA-1088. Where should I keep my medicine? Keep out of the reach of children. Store at room temperature below 25 degrees C (77 degrees F). Protect from light. Keep container tightly closed. Throw away any unused medicine after the expiration date. NOTE: This sheet is a summary. It may not cover all possible information. If you have questions about this medicine, talk to your doctor, pharmacist, or health care provider.    2016, Elsevier/Gold Standard. (2012-09-26 14:08:39)  Bacterial Vaginosis Bacterial vaginosis is a vaginal infection that occurs when the normal balance of bacteria in the vagina is disrupted. It results from an overgrowth of certain bacteria. This is the most common vaginal infection in women of childbearing age. Treatment is important to prevent complications, especially in pregnant women, as it can cause a premature delivery. CAUSES  Bacterial vaginosis is caused by an increase in harmful bacteria that are normally present in smaller amounts in the vagina. Several different kinds of bacteria can cause bacterial vaginosis. However, the reason that the condition develops is not fully understood. RISK FACTORS Certain activities or behaviors can put you at an increased risk of developing bacterial vaginosis, including:  Having a new sex partner or multiple sex partners.  Douching.  Using an intrauterine device (IUD) for contraception. Women do not get bacterial vaginosis from toilet seats, bedding, swimming pools, or contact  with objects around them. SIGNS AND SYMPTOMS  Some women with bacterial vaginosis have no signs or symptoms. Common symptoms include:  Grey vaginal discharge.  A fishlike odor with discharge, especially after sexual intercourse.  Itching or burning of the vagina and vulva.  Burning or pain with urination. DIAGNOSIS  Your health care provider will take a medical history and examine the vagina for signs of bacterial vaginosis. A sample of vaginal fluid may be taken. Your health care provider will look at this sample under a microscope to check for bacteria and abnormal cells. A vaginal pH test may also be done.  TREATMENT  Bacterial vaginosis may be treated with antibiotic medicines. These may be given in the form of a pill or a vaginal cream. A second round of antibiotics may be prescribed if the condition comes back after treatment. Because bacterial vaginosis increases your risk for sexually transmitted diseases, getting treated can help reduce your risk for chlamydia, gonorrhea, HIV, and herpes. HOME CARE INSTRUCTIONS   Only take over-the-counter or prescription medicines as directed by your health care provider.  If antibiotic medicine was prescribed, take it as directed. Make sure you finish it even if you start to feel better.  Tell all sexual partners that you have a vaginal infection. They should see their health care provider and be treated if they have problems, such as a mild rash or itching.  During treatment, it is important that you follow these instructions:  Avoid sexual activity or use condoms correctly.  Do not douche.  Avoid alcohol as directed by your health care provider.  Avoid breastfeeding as directed by your health care provider. SEEK MEDICAL CARE IF:   Your symptoms are not improving after 3 days of treatment.  You have increased discharge or pain.  You have a fever. MAKE SURE YOU:   Understand these instructions.  Will watch your condition.  Will  get help right away if you are not doing well or get worse. FOR MORE INFORMATION  Centers for Disease Control and Prevention, Division of STD Prevention: SolutionApps.co.zawww.cdc.gov/std American Sexual Health Association (ASHA): www.ashastd.org    This information is not intended to replace advice given to you by your health care provider. Make sure you discuss any questions you have with your health care provider.   Document Released: 02/19/2005 Document Revised: 03/12/2014 Document Reviewed: 10/01/2012 Elsevier Interactive Patient Education 2016 [employer] Trichomoniasis Trichomoniasis is an infection caused by an organism called Trichomonas. The infection can affect both women and men. In women, the outer female genitalia and the vagina are affected. In men, the penis is mainly affected, but the prostate and other reproductive organs can also be involved. Trichomoniasis is a sexually transmitted infection (STI) and is most often passed to another person through sexual contact.  RISK FACTORS  Having unprotected sexual intercourse.  Having sexual intercourse with an infected partner. SIGNS AND SYMPTOMS  Symptoms of trichomoniasis in women include:  Abnormal gray-green frothy vaginal discharge.  Itching and irritation of the vagina.  Itching and irritation of the area outside the vagina. Symptoms of trichomoniasis in men include:   Penile discharge with or without pain.  Pain during urination. This results from inflammation of the urethra. DIAGNOSIS  Trichomoniasis may be found during a Pap test or physical exam. Your health care provider may use one of the following methods to help diagnose this infection:  Testing the pH of the vagina with a test tape.  Using a vaginal swab test that checks for the Trichomonas organism. A test is available that provides results within a few minutes.  Examining a urine sample.  Testing vaginal secretions. Your health care provider may test you for other  STIs, including HIV. TREATMENT   You may be given medicine to fight the infection. Women should inform their health care provider if they could be or are pregnant. Some medicines used to treat the infection should not be taken during pregnancy.  Your health care provider may recommend over-the-counter medicines or creams to decrease itching or irritation.  Your sexual partner will need to be treated if infected.  Your health care provider may test you for infection again 3 months after treatment. HOME CARE INSTRUCTIONS   Take medicines only as directed by your health care provider.  Take over-the-counter medicine for itching or irritation as directed by your health care provider.  Do not have sexual intercourse while you have the infection.  Women should not douche or wear tampons while they have the infection.  Discuss your infection with your partner. Your partner may have gotten the infection from you, or you may have gotten it from your partner.  Have your sex partner get examined and treated if necessary.  Practice safe, informed, and protected sex.  See your health care provider for other STI testing. SEEK MEDICAL CARE IF:   You still have symptoms after you finish your medicine.  You develop abdominal pain.  You have pain when you urinate.  You have bleeding after sexual intercourse.  You  develop a rash.  Your medicine makes you sick or makes you throw up (vomit). MAKE SURE YOU:  Understand these instructions.  Will watch your condition.  Will get help right away if you are not doing well or get worse.   This information is not intended to replace advice given to you by your health care provider. Make sure you discuss any questions you have with your health care provider.   Document Released: 08/15/2000 Document Revised: 03/12/2014 Document Reviewed: 12/01/2012 Elsevier Interactive Patient Education [employer]

## 2015-12-30 ENCOUNTER — Other Ambulatory Visit: Payer: BLUE CROSS/BLUE SHIELD

## 2015-12-30 DIAGNOSIS — Z01411 Encounter for gynecological examination (general) (routine) with abnormal findings: Secondary | ICD-10-CM | POA: Diagnosis not present

## 2015-12-30 DIAGNOSIS — Z113 Encounter for screening for infections with a predominantly sexual mode of transmission: Secondary | ICD-10-CM | POA: Diagnosis not present

## 2015-12-30 LAB — CBC WITH DIFFERENTIAL/PLATELET
BASOS ABS: 41 {cells}/uL (ref 0–200)
Basophils Relative: 1 %
EOS ABS: 82 {cells}/uL (ref 15–500)
EOS PCT: 2 %
HCT: 40.1 % (ref 35.0–45.0)
Hemoglobin: 13.2 g/dL (ref 11.7–15.5)
Lymphocytes Relative: 26 %
Lymphs Abs: 1066 cells/uL (ref 850–3900)
MCH: 27.4 pg (ref 27.0–33.0)
MCHC: 32.9 g/dL (ref 32.0–36.0)
MCV: 83.4 fL (ref 80.0–100.0)
MONOS PCT: 7 %
MPV: 10.4 fL (ref 7.5–12.5)
Monocytes Absolute: 287 cells/uL (ref 200–950)
NEUTROS PCT: 64 %
Neutro Abs: 2624 cells/uL (ref 1500–7800)
PLATELETS: 204 10*3/uL (ref 140–400)
RBC: 4.81 MIL/uL (ref 3.80–5.10)
RDW: 13.7 % (ref 11.0–15.0)
WBC: 4.1 10*3/uL (ref 3.8–10.8)

## 2015-12-30 LAB — COMPREHENSIVE METABOLIC PANEL
ALBUMIN: 3.8 g/dL (ref 3.6–5.1)
ALT: 12 U/L (ref 6–29)
AST: 15 U/L (ref 10–30)
Alkaline Phosphatase: 35 U/L (ref 33–115)
BUN: 12 mg/dL (ref 7–25)
CHLORIDE: 106 mmol/L (ref 98–110)
CO2: 24 mmol/L (ref 20–31)
CREATININE: 0.79 mg/dL (ref 0.50–1.10)
Calcium: 8.4 mg/dL — ABNORMAL LOW (ref 8.6–10.2)
Glucose, Bld: 78 mg/dL (ref 65–99)
POTASSIUM: 4.1 mmol/L (ref 3.5–5.3)
SODIUM: 138 mmol/L (ref 135–146)
Total Bilirubin: 0.4 mg/dL (ref 0.2–1.2)
Total Protein: 6.5 g/dL (ref 6.1–8.1)

## 2015-12-30 LAB — URINALYSIS W MICROSCOPIC + REFLEX CULTURE
Bacteria, UA: NONE SEEN [HPF]
Bilirubin Urine: NEGATIVE
CASTS: NONE SEEN [LPF]
Crystals: NONE SEEN [HPF]
Glucose, UA: NEGATIVE
HGB URINE DIPSTICK: NEGATIVE
Ketones, ur: NEGATIVE
Leukocytes, UA: NEGATIVE
NITRITE: NEGATIVE
PH: 7.5 (ref 5.0–8.0)
PROTEIN: NEGATIVE
RBC / HPF: NONE SEEN RBC/HPF (ref <=2)
SQUAMOUS EPITHELIAL / LPF: NONE SEEN [HPF] (ref <=5)
Specific Gravity, Urine: 1.021 (ref 1.001–1.035)
WBC, UA: NONE SEEN WBC/HPF (ref <=5)
YEAST: NONE SEEN [HPF]

## 2015-12-30 LAB — LIPID PANEL
CHOL/HDL RATIO: 2.3 ratio (ref <=5.0)
CHOLESTEROL: 147 mg/dL (ref 125–200)
HDL: 63 mg/dL (ref >=46)
LDL Cholesterol: 74 mg/dL (ref <130)
Triglycerides: 51 mg/dL (ref <150)
VLDL: 10 mg/dL (ref <30)

## 2015-12-30 LAB — HEPATITIS B SURFACE ANTIGEN: Hepatitis B Surface Ag: NEGATIVE

## 2015-12-30 LAB — HEPATITIS C ANTIBODY: HCV AB: NEGATIVE

## 2015-12-30 LAB — GC/CHLAMYDIA PROBE AMP
CT Probe RNA: NOT DETECTED
GC PROBE AMP APTIMA: NOT DETECTED

## 2015-12-30 LAB — TSH: TSH: 1.22 mIU/L

## 2015-12-30 LAB — HIV ANTIBODY (ROUTINE TESTING W REFLEX): HIV 1&2 Ab, 4th Generation: NONREACTIVE

## 2015-12-31 LAB — RPR

## 2016-01-02 ENCOUNTER — Other Ambulatory Visit: Payer: Self-pay | Admitting: Gynecology

## 2016-01-03 ENCOUNTER — Telehealth: Payer: Self-pay | Admitting: *Deleted

## 2016-01-03 MED ORDER — METRONIDAZOLE 500 MG PO TABS: 500.0000 mg | ORAL_TABLET | Freq: Two times a day (BID) | ORAL | 0 refills | Status: DC

## 2016-01-03 NOTE — Telephone Encounter (Signed)
Rx sent, pt aware 

## 2016-01-03 NOTE — Telephone Encounter (Signed)
Pt was prescribed Flagyl 500 mg twice a day for 7 days on office visit 12/29/15, pt said she lost Rx over the weekend, needs new Rx. New Rx will be sent. Please advise

## 2016-01-03 NOTE — Telephone Encounter (Signed)
Ok

## 2016-02-24 ENCOUNTER — Other Ambulatory Visit: Payer: Self-pay | Admitting: Gynecology

## 2016-03-02 DIAGNOSIS — B353 Tinea pedis: Secondary | ICD-10-CM | POA: Diagnosis not present

## 2016-03-02 DIAGNOSIS — L602 Onychogryphosis: Secondary | ICD-10-CM | POA: Diagnosis not present

## 2016-05-04 ENCOUNTER — Other Ambulatory Visit: Payer: Self-pay | Admitting: Women's Health

## 2016-05-04 DIAGNOSIS — N951 Menopausal and female climacteric states: Secondary | ICD-10-CM

## 2016-05-10 ENCOUNTER — Ambulatory Visit (INDEPENDENT_AMBULATORY_CARE_PROVIDER_SITE_OTHER): Payer: BLUE CROSS/BLUE SHIELD | Admitting: Gynecology

## 2016-05-10 ENCOUNTER — Encounter: Payer: Self-pay | Admitting: Gynecology

## 2016-05-10 VITALS — BP 120/74

## 2016-05-10 DIAGNOSIS — N898 Other specified noninflammatory disorders of vagina: Secondary | ICD-10-CM

## 2016-05-10 DIAGNOSIS — Z113 Encounter for screening for infections with a predominantly sexual mode of transmission: Secondary | ICD-10-CM

## 2016-05-10 DIAGNOSIS — A599 Trichomoniasis, unspecified: Secondary | ICD-10-CM | POA: Diagnosis not present

## 2016-05-10 DIAGNOSIS — R590 Localized enlarged lymph nodes: Secondary | ICD-10-CM | POA: Diagnosis not present

## 2016-05-10 LAB — WET PREP FOR TRICH, YEAST, CLUE: Yeast Wet Prep HPF POC: NONE SEEN

## 2016-05-10 LAB — HEPATITIS B SURFACE ANTIBODY,QUALITATIVE: HEP B S AB: NEGATIVE

## 2016-05-10 LAB — HEPATITIS C ANTIBODY: HCV Ab: NEGATIVE

## 2016-05-10 MED ORDER — METRONIDAZOLE 500 MG PO TABS: 500.0000 mg | ORAL_TABLET | Freq: Two times a day (BID) | ORAL | 0 refills | Status: DC

## 2016-05-10 NOTE — Progress Notes (Signed)
   Patient is a 41 year old that presented to the office today complaining of a tender nodular area on her right groin and for the past week she's had some discharge clear with odor but no pruritus. Review of her record indicated that last October she had a full STD screening that was negative with the exception that she was treated for trichomoniasis. Patient is on the birth control pill for contraception. Pap smear 2 years ago normal. Patient stated a few days ago she brought over-the-counter Monistat but continues with the symptoms.  Exam: Abdomen: Soft nontender no rebound or guarding Right groin tender lymph node. Pelvic: Bartholin urethra Skene was within normal limits Vagina: Clear discharge with odor Cervix: No gross lesions on inspection Bimanual exam: Uterus anteverted normal size shape and consistency Adnexa: No palpable masses or tenderness Rectal exam not done  Wet prep: Moderate clue cells, many white blood cells, too numerous to count bacteria Trichomoniasis  Assessment/plan: Patient will be treated for trichomoniasis with Flagyl 500 mg one by mouth twice a day for 7 days. A GC and Chlamydia culture was obtained today. To complete the STD screening and HIV, RPR, hepatitis B and C was obtained results pending at time of this dictation. She will return back to the office in 3 weeks for test of cure and to reexamine her groin area as well to see if the reactive lymphadenopathy has resolved. Patient to inform her partner that he needs to be screened and treated for trichomoniasis.

## 2016-05-10 NOTE — Patient Instructions (Signed)

## 2016-05-11 LAB — GC/CHLAMYDIA PROBE AMP
CT Probe RNA: NOT DETECTED
GC Probe RNA: NOT DETECTED

## 2016-05-11 LAB — RPR

## 2016-05-11 LAB — HIV ANTIBODY (ROUTINE TESTING W REFLEX): HIV 1&2 Ab, 4th Generation: NONREACTIVE

## 2016-05-30 ENCOUNTER — Ambulatory Visit (INDEPENDENT_AMBULATORY_CARE_PROVIDER_SITE_OTHER): Payer: BLUE CROSS/BLUE SHIELD | Admitting: Gynecology

## 2016-05-30 ENCOUNTER — Encounter: Payer: Self-pay | Admitting: Gynecology

## 2016-05-30 VITALS — BP 118/76

## 2016-05-30 DIAGNOSIS — Z8619 Personal history of other infectious and parasitic diseases: Secondary | ICD-10-CM | POA: Diagnosis not present

## 2016-05-30 DIAGNOSIS — B351 Tinea unguium: Secondary | ICD-10-CM | POA: Diagnosis not present

## 2016-05-30 DIAGNOSIS — B353 Tinea pedis: Secondary | ICD-10-CM | POA: Diagnosis not present

## 2016-05-30 LAB — WET PREP FOR TRICH, YEAST, CLUE
Clue Cells Wet Prep HPF POC: NONE SEEN
Trich, Wet Prep: NONE SEEN
Yeast Wet Prep HPF POC: NONE SEEN

## 2016-05-30 NOTE — Progress Notes (Signed)
   Patient is a 41 year old who was seen the office on March 8 complaining of a vaginal discharge with odor but no pruritus. Her wet prep demonstrated trichomoniasis for which she was treated with Flagyl 500 mg twice a day for 7 days. She had a full STD screening consisting of HIV, RPR, hepatitis B and C as well as GC and chlamydia culture were all negative. Patient is partner was treated. Patient asymptomatic and C for test of cure.  Wet prep done today otherwise negative.  Patient to return in October this year for annual exam or when necessary.

## 2016-05-30 NOTE — Addendum Note (Signed)
Addended by: Dayna BarkerGARDNER, Deane Melick K on: 05/30/2016 12:09 PM   Modules accepted: Orders

## 2016-06-28 DIAGNOSIS — B351 Tinea unguium: Secondary | ICD-10-CM | POA: Diagnosis not present

## 2016-06-28 DIAGNOSIS — B353 Tinea pedis: Secondary | ICD-10-CM | POA: Diagnosis not present

## 2016-07-18 ENCOUNTER — Encounter: Payer: Self-pay | Admitting: Gynecology

## 2016-09-27 DIAGNOSIS — B351 Tinea unguium: Secondary | ICD-10-CM | POA: Diagnosis not present

## 2016-09-27 DIAGNOSIS — B353 Tinea pedis: Secondary | ICD-10-CM | POA: Diagnosis not present

## 2016-12-03 ENCOUNTER — Other Ambulatory Visit: Payer: Self-pay

## 2016-12-03 ENCOUNTER — Other Ambulatory Visit: Payer: Self-pay | Admitting: *Deleted

## 2016-12-03 MED ORDER — LEVONORGESTREL-ETHINYL ESTRAD 0.1-20 MG-MCG PO TABS: ORAL_TABLET | ORAL | 0 refills | Status: DC

## 2016-12-03 NOTE — Telephone Encounter (Signed)
Patient has annual scheduled on 12/19/16, needs refill on birth control pills. Rx sent.

## 2016-12-03 NOTE — Telephone Encounter (Signed)
CE scheduled Oct 19 with [place].

## 2016-12-19 ENCOUNTER — Encounter: Payer: Self-pay | Admitting: Women's Health

## 2016-12-19 ENCOUNTER — Ambulatory Visit (INDEPENDENT_AMBULATORY_CARE_PROVIDER_SITE_OTHER): Payer: BLUE CROSS/BLUE SHIELD | Admitting: Women's Health

## 2016-12-19 VITALS — BP 122/80 | Ht 59.0 in | Wt 148.0 lb

## 2016-12-19 DIAGNOSIS — Z3041 Encounter for surveillance of contraceptive pills: Secondary | ICD-10-CM | POA: Diagnosis not present

## 2016-12-19 DIAGNOSIS — Z01419 Encounter for gynecological examination (general) (routine) without abnormal findings: Secondary | ICD-10-CM | POA: Diagnosis not present

## 2016-12-19 DIAGNOSIS — N951 Menopausal and female climacteric states: Secondary | ICD-10-CM

## 2016-12-19 DIAGNOSIS — Z13220 Encounter for screening for lipoid disorders: Secondary | ICD-10-CM

## 2016-12-19 DIAGNOSIS — N760 Acute vaginitis: Secondary | ICD-10-CM | POA: Diagnosis not present

## 2016-12-19 DIAGNOSIS — B9689 Other specified bacterial agents as the cause of diseases classified elsewhere: Secondary | ICD-10-CM | POA: Diagnosis not present

## 2016-12-19 LAB — CBC WITH DIFFERENTIAL/PLATELET
BASOS ABS: 32 {cells}/uL (ref 0–200)
Basophils Relative: 0.8 %
EOS PCT: 1.8 %
Eosinophils Absolute: 72 cells/uL (ref 15–500)
HCT: 39.3 % (ref 35.0–45.0)
Hemoglobin: 13 g/dL (ref 11.7–15.5)
Lymphs Abs: 2076 cells/uL (ref 850–3900)
MCH: 27.6 pg (ref 27.0–33.0)
MCHC: 33.1 g/dL (ref 32.0–36.0)
MCV: 83.4 fL (ref 80.0–100.0)
MPV: 11.5 fL (ref 7.5–12.5)
Monocytes Relative: 5.8 %
NEUTROS PCT: 39.7 %
Neutro Abs: 1588 cells/uL (ref 1500–7800)
PLATELETS: 260 10*3/uL (ref 140–400)
RBC: 4.71 10*6/uL (ref 3.80–5.10)
RDW: 12.8 % (ref 11.0–15.0)
TOTAL LYMPHOCYTE: 51.9 %
WBC mixed population: 232 cells/uL (ref 200–950)
WBC: 4 10*3/uL (ref 3.8–10.8)

## 2016-12-19 LAB — LIPID PANEL
CHOL/HDL RATIO: 2.5 (calc) (ref <5.0)
CHOLESTEROL: 159 mg/dL (ref <200)
HDL: 63 mg/dL (ref >50)
LDL Cholesterol (Calc): 79 mg/dL (calc)
NON-HDL CHOLESTEROL (CALC): 96 mg/dL (ref <130)
Triglycerides: 86 mg/dL (ref <150)

## 2016-12-19 LAB — WET PREP FOR TRICH, YEAST, CLUE

## 2016-12-19 LAB — GLUCOSE, RANDOM: Glucose, Bld: 82 mg/dL (ref 65–99)

## 2016-12-19 MED ORDER — METRONIDAZOLE 500 MG PO TABS: 500.0000 mg | ORAL_TABLET | Freq: Two times a day (BID) | ORAL | 0 refills | Status: DC

## 2016-12-19 MED ORDER — LEVONORGESTREL-ETHINYL ESTRAD 0.1-20 MG-MCG PO TABS: ORAL_TABLET | ORAL | 4 refills | Status: DC

## 2016-12-19 MED ORDER — VENLAFAXINE HCL ER 37.5 MG PO CP24: ORAL_CAPSULE | ORAL | 4 refills | Status: DC

## 2016-12-19 NOTE — Addendum Note (Signed)
Addended by: Tito DineBONHAM, KIM A on: 12/19/2016 09:01 AM   Modules accepted: Orders

## 2016-12-19 NOTE — Progress Notes (Signed)
Heather Anderson 01/08/1976 [id number]    History:    Presents for annual exam.  Cycles every 3 months on OCs. Same partner On Effexor 37.5 daily with good relief of anxiety. History of menstrual migraines, has done better with cycling every 3 months. Normal Pap history. Has not had a screening mammogram.  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 Has gained  20 pounds.  ROS:  A ROS was performed and pertinent positives and negatives are included.  Exam:  Vitals:   12/19/16 0825  BP: 122/80  Weight: 148 lb (67.1 kg)  Height: 4\' 11"  (1.499 m)   Body mass index is 29.89 kg/m.   General appearance:  Normal Thyroid:  Symmetrical, normal in size, without palpable masses or nodularity. Respiratory  Auscultation:  Clear without wheezing or rhonchi Cardiovascular  Auscultation:  Regular rate, without rubs, murmurs or gallops  Edema/varicosities:  Not grossly evident Abdominal  Soft,nontender, without masses, guarding or rebound.  Liver/spleen:  No organomegaly noted  Hernia:  None appreciated  Skin  Inspection:  Grossly normal   Breasts: Examined lying and sitting.     Right: Without masses, retractions, discharge or axillary adenopathy.     Left: Without masses, retractions, discharge or axillary adenopathy. Gentitourinary   Inguinal/mons:  Normal without inguinal adenopathy VAGINA: adherent white discharge with odor wet prep positive for clues, TNT  Cervix:  Normal  Uterus: normal in size, shape and contour.  Midline and mobile  Adnexa/parametria:     Rt: Without masses or tenderness.   Lt: Without masses or tenderness.  Anus and perineum: Normal  Digital rectal exam: Normal sphincter tone without palpated masses or tenderness  Assessment/Plan:  41 y.o. SBF G3 P21 for annual exam with no complaints.  Cycles every 3 months on OCs Bacterial vaginosis  Plan:  Flagyl 500 twice daily for  7 days, alcohol precautions reviewed. Orcythia prescription, proper use, slight risk for blood clots and strokes reviewed.Will continue to cycle every 3 months,  light cycle.Effexor XR 37.5 daily prescription, proper use given and reviewed, counseling as needed, exercise encouraged. SBE's, reviewed importance of annual screening mammogram, breast center information given instructed to schedule. Vitamin D 1000 daily encouraged. Encouraged increased exercise and decrease calories .CBC,CMP, lipid panel, Pap  HR HPV typing, new Denies need for STD screen, same partner.   Harrington Challengerancy J Young [facility] LLCWHNP, 8:45 AM 12/19/2016

## 2016-12-19 NOTE — Patient Instructions (Signed)
[facility]ammogram [facility] center [phone] Health Maintenance, Female Adopting a healthy lifestyle and getting preventive care can go a long way to promote health and wellness. Talk with your health care provider about what schedule of regular examinations is right for you. This is a good chance for you to check in with your provider about disease prevention and staying healthy. In between checkups, there are plenty of things you can do on your own. Experts have done a lot of research about which lifestyle changes and preventive measures are most likely to keep you healthy. Ask your health care provider for more information. Weight and diet Eat a healthy diet  Be sure to include plenty of vegetables, fruits, low-fat dairy products, and lean protein.  Do not eat a lot of foods high in solid fats, added sugars, or salt.  Get regular exercise. This is one of the most important things you can do for your health. ? Most adults should exercise for at least 150 minutes each week. The exercise should increase your heart rate and make you sweat (moderate-intensity exercise). ? Most adults should also do strengthening exercises at least twice a week. This is in addition to the moderate-intensity exercise.  Maintain a healthy weight  Body mass index (BMI) is a measurement that can be used to identify possible weight problems. It estimates body fat based on height and weight. Your health care provider can help determine your BMI and help you achieve or maintain a healthy weight.  For females 20 years of age and older: ? A BMI below 18.5 is considered underweight. ? A BMI of 18.5 to 24.9 is normal. ? A BMI of 25 to 29.9 is considered overweight. ? A BMI of 30 and above is considered obese.  Watch levels of cholesterol and blood lipids  You should start having your blood tested for lipids and cholesterol at 41 years of age, then have this test every 5 years.  You may need to have your cholesterol levels checked  more often if: ? Your lipid or cholesterol levels are high. ? You are older than 41 years of age. ? You are at high risk for heart disease.  Cancer screening Lung Cancer  Lung cancer screening is recommended for adults 55-80 years old who are at high risk for lung cancer because of a history of smoking.  A yearly low-dose CT scan of the lungs is recommended for people who: ? Currently smoke. ? Have quit within the past 15 years. ? Have at least a 30-pack-year history of smoking. A pack year is smoking an average of one pack of cigarettes a day for 1 year.  Yearly screening should continue until it has been 15 years since you quit.  Yearly screening should stop if you develop a health problem that would prevent you from having lung cancer treatment.  Breast Cancer  Practice breast self-awareness. This means understanding how your breasts normally appear and feel.  It also means doing regular breast self-exams. Let your health care provider know about any changes, no matter how small.  If you are in your 20s or 30s, you should have a clinical breast exam (CBE) by a health care provider every 1-3 years as part of a regular health exam.  If you are 40 or older, have a CBE every year. Also consider having a breast X-ray (mammogram) every year.  If you have a family history of breast cancer, talk to your health care provider about genetic screening.  If you   you are at high risk for breast cancer, talk to your health care provider about having an MRI and a mammogram every year.  Breast cancer gene (BRCA) assessment is recommended for women who have family members with BRCA-related cancers. BRCA-related cancers include: ? Breast. ? Ovarian. ? Tubal. ? Peritoneal cancers.  Results of the assessment will determine the need for genetic counseling and BRCA1 and BRCA2 testing.  Cervical Cancer Your health care provider may recommend that you be screened regularly for cancer of the  pelvic organs (ovaries, uterus, and vagina). This screening involves a pelvic examination, including checking for microscopic changes to the surface of your cervix (Pap test). You may be encouraged to have this screening done every 3 years, beginning at age 80.  For women ages 37-65, health care providers may recommend pelvic exams and Pap testing every 3 years, or they may recommend the Pap and pelvic exam, combined with testing for human papilloma virus (HPV), every 5 years. Some types of HPV increase your risk of cervical cancer. Testing for HPV may also be done on women of any age with unclear Pap test results.  Other health care providers may not recommend any screening for nonpregnant women who are considered low risk for pelvic cancer and who do not have symptoms. Ask your health care provider if a screening pelvic exam is right for you.  If you have had past treatment for cervical cancer or a condition that could lead to cancer, you need Pap tests and screening for cancer for at least 20 years after your treatment. If Pap tests have been discontinued, your risk factors (such as having a new sexual partner) need to be reassessed to determine if screening should resume. Some women have medical problems that increase the chance of getting cervical cancer. In these cases, your health care provider may recommend more frequent screening and Pap tests.  Colorectal Cancer  This type of cancer can be detected and often prevented.  Routine colorectal cancer screening usually begins at 41 years of age and continues through 41 years of age.  Your health care provider may recommend screening at an earlier age if you have risk factors for colon cancer.  Your health care provider may also recommend using home test kits to check for hidden blood in the stool.  A small camera at the end of a tube can be used to examine your colon directly (sigmoidoscopy or colonoscopy). This is done to check for the  earliest forms of colorectal cancer.  Routine screening usually begins at age 69.  Direct examination of the colon should be repeated every 5-10 years through 41 years of age. However, you may need to be screened more often if early forms of precancerous polyps or small growths are found.  Skin Cancer  Check your skin from head to toe regularly.  Tell your health care provider about any new moles or changes in moles, especially if there is a change in a mole's shape or color.  Also tell your health care provider if you have a mole that is larger than the size of a pencil eraser.  Always use sunscreen. Apply sunscreen liberally and repeatedly throughout the day.  Protect yourself by wearing long sleeves, pants, a wide-brimmed hat, and sunglasses whenever you are outside.  Heart disease, diabetes, and high blood pressure  High blood pressure causes heart disease and increases the risk of stroke. High blood pressure is more likely to develop in: ? People who have blood pressure  in the high end of the normal range (130-139/85-89 mm Hg). ? People who are overweight or obese. ? People who are African American.  If you are 43-51 years of age, have your blood pressure checked every 3-5 years. If you are 13 years of age or older, have your blood pressure checked every year. You should have your blood pressure measured twice-once when you are at a hospital or clinic, and once when you are not at a hospital or clinic. Record the average of the two measurements. To check your blood pressure when you are not at a hospital or clinic, you can use: ? An automated blood pressure machine at a pharmacy. ? A home blood pressure monitor.  If you are between 98 years and 41 years old, ask your health care provider if you should take aspirin to prevent strokes.  Have regular diabetes screenings. This involves taking a blood sample to check your fasting blood sugar level. ? If you are at a normal weight and  have a low risk for diabetes, have this test once every three years after 41 years of age. ? If you are overweight and have a high risk for diabetes, consider being tested at a younger age or more often. Preventing infection Hepatitis B  If you have a higher risk for hepatitis B, you should be screened for this virus. You are considered at high risk for hepatitis B if: ? You were born in a country where hepatitis B is common. Ask your health care provider which countries are considered high risk. ? Your parents were born in a high-risk country, and you have not been immunized against hepatitis B (hepatitis B vaccine). ? You have HIV or AIDS. ? You use needles to inject street drugs. ? You live with someone who has hepatitis B. ? You have had sex with someone who has hepatitis B. ? You get hemodialysis treatment. ? You take certain medicines for conditions, including cancer, organ transplantation, and autoimmune conditions.  Hepatitis C  Blood testing is recommended for: ? Everyone born from [birth year] through 1965. ? Anyone with known risk factors for hepatitis C.  Sexually transmitted infections (STIs)  You should be screened for sexually transmitted infections (STIs) including gonorrhea and chlamydia if: ? You are sexually active and are younger than 41 years of age. ? You are older than 41 years of age and your health care provider tells you that you are at risk for this type of infection. ? Your sexual activity has changed since you were last screened and you are at an increased risk for chlamydia or gonorrhea. Ask your health care provider if you are at risk.  If you do not have HIV, but are at risk, it may be recommended that you take a prescription medicine daily to prevent HIV infection. This is called pre-exposure prophylaxis (PrEP). You are considered at risk if: ? You are sexually active and do not regularly use condoms or know the HIV status of your partner(s). ? You take drugs by  injection. ? You are sexually active with a partner who has HIV.  Talk with your health care provider about whether you are at high risk of being infected with HIV. If you choose to begin PrEP, you should first be tested for HIV. You should then be tested every 3 months for as long as you are taking PrEP. Pregnancy  If you are premenopausal and you may become pregnant, ask your health care provider about preconception counseling.  If you may become pregnant, take 400 to 800 micrograms (mcg) of folic acid every day.  If you want to prevent pregnancy, talk to your health care provider about birth control (contraception). Osteoporosis and menopause  Osteoporosis is a disease in which the bones lose minerals and strength with aging. This can result in serious bone fractures. Your risk for osteoporosis can be identified using a bone density scan.  If you are 17 years of age or older, or if you are at risk for osteoporosis and fractures, ask your health care provider if you should be screened.  Ask your health care provider whether you should take a calcium or vitamin D supplement to lower your risk for osteoporosis.  Menopause may have certain physical symptoms and risks.  Hormone replacement therapy may reduce some of these symptoms and risks. Talk to your health care provider about whether hormone replacement therapy is right for you. Follow these instructions at home:  Schedule regular health, dental, and eye exams.  Stay current with your immunizations.  Do not use any tobacco products including cigarettes, chewing tobacco, or electronic cigarettes.  If you are pregnant, do not drink alcohol.  If you are breastfeeding, limit how much and how often you drink alcohol.  Limit alcohol intake to no more than 1 drink per day for nonpregnant women. One drink equals 12 ounces of beer, 5 ounces of wine, or 1 ounces of hard liquor.  Do not use street drugs.  Do not share needles.  Ask  your health care provider for help if you need support or information about quitting drugs.  Tell your health care provider if you often feel depressed.  Tell your health care provider if you have ever been abused or do not feel safe at home. This information is not intended to replace advice given to you by your health care provider. Make sure you discuss any questions you have with your health care provider. Document Released: 09/04/2010 Document Revised: 07/28/2015 Document Reviewed: 11/23/2014 Elsevier Interactive Patient Education  [employer].

## 2016-12-20 LAB — PAP, TP IMAGING W/ HPV RNA, RFLX HPV TYPE 16,18/45: HPV DNA High Risk: NOT DETECTED

## 2017-05-16 ENCOUNTER — Telehealth: Payer: Self-pay | Admitting: *Deleted

## 2017-05-16 NOTE — Telephone Encounter (Signed)
I recommend starting with more water, more fibers, prunes/grapes, stool softener as needed and Preparation H treatment as Hemorrhoids are the most likely cause.  Then, can come here in a week for evaluation and we will refer to GI if needed.

## 2017-05-16 NOTE — Telephone Encounter (Signed)
You are back up md) pt called c/o blood in stool this am, thinks maybe she has a stomach bug due to diarrhea that she has had. 2 trips to bathroom and both noticeable blood in stool. Pt said she did not urinate so this confirms stool. She doesn't have PCP or GI md, pt wanted to come here, I told her I would check with you to see how to proceed. Do you recommend urgent care, refer to GI based off patient? Please advise

## 2017-05-16 NOTE — Telephone Encounter (Signed)
Pt informed

## 2017-11-20 ENCOUNTER — Other Ambulatory Visit: Payer: Self-pay | Admitting: Women's Health

## 2017-11-20 DIAGNOSIS — Z1231 Encounter for screening mammogram for malignant neoplasm of breast: Secondary | ICD-10-CM

## 2017-12-19 ENCOUNTER — Ambulatory Visit
Admission: RE | Admit: 2017-12-19 | Discharge: 2017-12-19 | Disposition: A | Discharge status: Home / Self Care | Payer: BLUE CROSS/BLUE SHIELD | Source: Ambulatory Visit | Attending: Women's Health | Admitting: Women's Health

## 2017-12-19 DIAGNOSIS — Z1231 Encounter for screening mammogram for malignant neoplasm of breast: Secondary | ICD-10-CM

## 2017-12-20 ENCOUNTER — Other Ambulatory Visit: Payer: Self-pay | Admitting: Women's Health

## 2017-12-20 DIAGNOSIS — R928 Other abnormal and inconclusive findings on diagnostic imaging of breast: Secondary | ICD-10-CM

## 2017-12-24 ENCOUNTER — Ambulatory Visit (INDEPENDENT_AMBULATORY_CARE_PROVIDER_SITE_OTHER): Payer: BLUE CROSS/BLUE SHIELD | Admitting: Women's Health

## 2017-12-24 ENCOUNTER — Encounter: Payer: Self-pay | Admitting: Women's Health

## 2017-12-24 VITALS — BP 118/76 | Ht 60.0 in | Wt 149.0 lb

## 2017-12-24 DIAGNOSIS — Z13220 Encounter for screening for lipoid disorders: Secondary | ICD-10-CM

## 2017-12-24 DIAGNOSIS — Z3041 Encounter for surveillance of contraceptive pills: Secondary | ICD-10-CM

## 2017-12-24 DIAGNOSIS — N951 Menopausal and female climacteric states: Secondary | ICD-10-CM | POA: Diagnosis not present

## 2017-12-24 DIAGNOSIS — Z01419 Encounter for gynecological examination (general) (routine) without abnormal findings: Secondary | ICD-10-CM | POA: Diagnosis not present

## 2017-12-24 MED ORDER — LEVONORGESTREL-ETHINYL ESTRAD 0.1-20 MG-MCG PO TABS: ORAL_TABLET | ORAL | 4 refills | Status: DC

## 2017-12-24 MED ORDER — VENLAFAXINE HCL ER 37.5 MG PO CP24: ORAL_CAPSULE | ORAL | 4 refills | Status: DC

## 2017-12-24 NOTE — Progress Notes (Signed)
Heather Anderson 1975/09/28 [id number]    History:    Presents for annual exam.  Cycles every 3 months on OCs continuously.  Normal Pap and mammogram history.  On Effexor 37.5 with good relief of anxiety.  Same partner with negative STD screen, planning marriage.  History of migraines without aura.  Past medical history, past surgical history, family history and social history were all reviewed and documented in the EPIC chart.  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  Parents healthy.  ROS:  A ROS was performed and pertinent positives and negatives are included.  Exam:  Vitals:   12/24/17 0904  BP: 118/76  Weight: 149 lb (67.6 kg)  Height: 5' (1.524 m)   Body mass index is 29.1 kg/m.   General appearance:  Normal Thyroid:  Symmetrical, normal in size, without palpable masses or nodularity. Respiratory  Auscultation:  Clear without wheezing or rhonchi Cardiovascular  Auscultation:  Regular rate, without rubs, murmurs or gallops  Edema/varicosities:  Not grossly evident Abdominal  Soft,nontender, without masses, guarding or rebound.  Liver/spleen:  No organomegaly noted  Hernia:  None appreciated  Skin  Inspection:  Grossly normal   Breasts: Examined lying and sitting.     Right: Without masses, retractions, discharge or axillary adenopathy.     Left: Without masses, retractions, discharge or axillary adenopathy. Gentitourinary   Inguinal/mons:  Normal without inguinal adenopathy  External genitalia:  Normal  BUS/Urethra/Skene's glands:  Normal  Vagina:  Normal  Cervix:  Normal  Uterus:  normal in size, shape and contour.  Midline and mobile  Adnexa/parametria:     Rt: Without masses or tenderness.   Lt: Without masses or tenderness.  Anus and perineum: Normal  Digital rectal exam: Normal sphincter tone without palpated masses or tenderness  Assessment/Plan:  42 y.o. SB F G3P1 for annual exam with no complaints.Sherlynn Stalls cycle or no cycle every 3 months on OCs  continuously Anxiety good relief with low-dose Effexor Migraines without aura  Plan: Contraception options reviewed, would like to try Mirena IUD had one after delivery of son and did well.  Reviewed slight risks of infection, perforation and hemorrhage.  Instructed to have Dr. Audie Box place with menses.  Will schedule.  We will continue Loestrin until placement.  Prescription, proper use, slight risk for blood clots and strokes reviewed.  Effexor 37.5 prescription, proper use given and reviewed.  Reviewed importance of exercise, leisure activities.  SBE's, increase regular exercise, calcium rich foods, vitamin D 1000 daily encouraged.  Pap normal 2018, new screening guidelines reviewed.  CBC, CMP, lipid panel.   Harrington Challenger [facility], 9:33 AM 12/24/2017

## 2017-12-24 NOTE — Patient Instructions (Signed)

## 2017-12-25 ENCOUNTER — Other Ambulatory Visit: Payer: Self-pay | Admitting: Women's Health

## 2017-12-25 ENCOUNTER — Other Ambulatory Visit: Payer: BLUE CROSS/BLUE SHIELD

## 2017-12-25 ENCOUNTER — Ambulatory Visit
Admission: RE | Admit: 2017-12-25 | Discharge: 2017-12-25 | Disposition: A | Discharge status: Home / Self Care | Payer: BLUE CROSS/BLUE SHIELD | Source: Ambulatory Visit | Attending: Women's Health | Admitting: Women's Health

## 2017-12-25 DIAGNOSIS — R928 Other abnormal and inconclusive findings on diagnostic imaging of breast: Secondary | ICD-10-CM | POA: Diagnosis not present

## 2017-12-25 DIAGNOSIS — N6320 Unspecified lump in the left breast, unspecified quadrant: Secondary | ICD-10-CM

## 2017-12-25 DIAGNOSIS — N6322 Unspecified lump in the left breast, upper inner quadrant: Secondary | ICD-10-CM | POA: Diagnosis not present

## 2017-12-25 LAB — CBC WITH DIFFERENTIAL/PLATELET
BASOS PCT: 1 %
Basophils Absolute: 40 cells/uL (ref 0–200)
EOS PCT: 1.8 %
Eosinophils Absolute: 72 cells/uL (ref 15–500)
HCT: 40.4 % (ref 35.0–45.0)
Hemoglobin: 13.3 g/dL (ref 11.7–15.5)
Lymphs Abs: 1980 cells/uL (ref 850–3900)
MCH: 27.8 pg (ref 27.0–33.0)
MCHC: 32.9 g/dL (ref 32.0–36.0)
MCV: 84.5 fL (ref 80.0–100.0)
MONOS PCT: 6.5 %
MPV: 12 fL (ref 7.5–12.5)
NEUTROS PCT: 41.2 %
Neutro Abs: 1648 cells/uL (ref 1500–7800)
PLATELETS: 269 10*3/uL (ref 140–400)
RBC: 4.78 10*6/uL (ref 3.80–5.10)
RDW: 12.9 % (ref 11.0–15.0)
TOTAL LYMPHOCYTE: 49.5 %
WBC mixed population: 260 cells/uL (ref 200–950)
WBC: 4 10*3/uL (ref 3.8–10.8)

## 2017-12-25 LAB — LIPID PANEL
CHOL/HDL RATIO: 2.6 (calc) (ref <5.0)
CHOLESTEROL: 161 mg/dL (ref <200)
HDL: 63 mg/dL (ref >50)
LDL Cholesterol (Calc): 84 mg/dL (calc)
Non-HDL Cholesterol (Calc): 98 mg/dL (calc) (ref <130)
Triglycerides: 68 mg/dL (ref <150)

## 2017-12-25 LAB — COMPREHENSIVE METABOLIC PANEL
AG Ratio: 1.3 (calc) (ref 1.0–2.5)
ALBUMIN MSPROF: 3.9 g/dL (ref 3.6–5.1)
ALT: 12 U/L (ref 6–29)
AST: 19 U/L (ref 10–30)
Alkaline phosphatase (APISO): 44 U/L (ref 33–115)
BILIRUBIN TOTAL: 0.4 mg/dL (ref 0.2–1.2)
BUN: 12 mg/dL (ref 7–25)
CALCIUM: 8.8 mg/dL (ref 8.6–10.2)
CO2: 24 mmol/L (ref 20–32)
CREATININE: 0.78 mg/dL (ref 0.50–1.10)
Chloride: 104 mmol/L (ref 98–110)
GLUCOSE: 79 mg/dL (ref 65–99)
Globulin: 3 g/dL (calc) (ref 1.9–3.7)
POTASSIUM: 4.1 mmol/L (ref 3.5–5.3)
SODIUM: 137 mmol/L (ref 135–146)
TOTAL PROTEIN: 6.9 g/dL (ref 6.1–8.1)

## 2018-01-03 HISTORY — PX: INTRAUTERINE DEVICE INSERTION: SHX323

## 2018-01-13 ENCOUNTER — Encounter: Payer: Self-pay | Admitting: Gynecology

## 2018-01-13 ENCOUNTER — Ambulatory Visit (INDEPENDENT_AMBULATORY_CARE_PROVIDER_SITE_OTHER): Payer: BLUE CROSS/BLUE SHIELD | Admitting: Gynecology

## 2018-01-13 VITALS — BP 118/76

## 2018-01-13 DIAGNOSIS — Z30430 Encounter for insertion of intrauterine contraceptive device: Secondary | ICD-10-CM

## 2018-01-13 NOTE — Patient Instructions (Signed)

## 2018-01-13 NOTE — Progress Notes (Signed)
    DANAYA GEDDIS October 21, 1975 [id number]        42 y.o.  [id number]  presents for Mirena IUD placement.  Previously counseled by Harriett Sine.  She has read through the booklet, has no contraindications and signed the consent form.  She is doing an every 44-month withdrawal with her oral contraceptives and is on her pill free week.  Does not have menses routinely.  Did have intercourse yesterday.  I discussed with her although would be rare it is possible she could be very early pregnant undetectable by a pregnancy test.  Options to postpone IUD versus proceeding discussed.  The patient wants to proceed despite possibility however unlikely early pregnancy.  I reviewed the insertional process with her as well as the risks to include infection, either immediate or long-term, uterine perforation or migration requiring surgery to remove, other complications such as pain, hormonal side effects and possibility of failure with subsequent pregnancy.   Exam with Kennon Portela assistant Vitals:   01/13/18 1133  BP: 118/76    Pelvic: External BUS vagina normal. Cervix normal. Uterus anteverted normal size shape contour midline mobile nontender. Adnexa without masses or tenderness.  Procedure: The cervix was cleansed with Betadine, anterior lip grasped with a single-tooth tenaculum, the uterus was sounded and a Mirena IUD was placed according to manufacturer's recommendations without difficulty. The strings were trimmed. The patient tolerated well and will follow up in one month for a postinsertional check.  Lot number:  [id number]    Dara Lords MD, 11:47 AM 01/13/2018

## 2018-02-17 ENCOUNTER — Ambulatory Visit: Payer: BLUE CROSS/BLUE SHIELD | Admitting: Women's Health

## 2018-02-17 ENCOUNTER — Encounter: Payer: Self-pay | Admitting: Women's Health

## 2018-02-17 VITALS — BP 130/80

## 2018-02-17 DIAGNOSIS — N760 Acute vaginitis: Secondary | ICD-10-CM

## 2018-02-17 DIAGNOSIS — B9689 Other specified bacterial agents as the cause of diseases classified elsewhere: Secondary | ICD-10-CM

## 2018-02-17 DIAGNOSIS — Z30431 Encounter for routine checking of intrauterine contraceptive device: Secondary | ICD-10-CM

## 2018-02-17 LAB — WET PREP FOR TRICH, YEAST, CLUE

## 2018-02-17 MED ORDER — METRONIDAZOLE 0.75 % VA GEL: VAGINAL | 2 refills | Status: DC

## 2018-02-17 NOTE — Progress Notes (Signed)
42 year old MWF S BF G3, P1 presents for Mirena IUD check.  01/2018 Mirena IUD placed had light bleeding first 2 weeks and has now been amenorrheic.  Has had problems with recurrent BV in the past.  Same partner with negative STD screen.  Denies any concerns today, denies discharge, urinary symptoms, abdominal pain or fever.  Exam: Appears well.  External genitalia within normal limits, speculum exam moderate amount of a white adherent discharge with odor noted, wet prep positive for clues, many bacteria.  IUD strings in os.  01/2018 Mirena IUD placed per Dr. Audie BoxFontaine Bacterial vaginosis  Plan: MetroGel vaginal cream 1 applicator at bedtime x5 and then weekly for several weeks, has had problems with recurrence.  Instructed to call if continued problems.

## 2018-02-17 NOTE — Patient Instructions (Signed)

## 2018-07-03 ENCOUNTER — Ambulatory Visit
Admission: RE | Admit: 2018-07-03 | Discharge: 2018-07-03 | Disposition: A | Discharge status: Home / Self Care | Payer: BLUE CROSS/BLUE SHIELD | Source: Ambulatory Visit | Attending: Women's Health | Admitting: Women's Health

## 2018-07-03 ENCOUNTER — Other Ambulatory Visit: Payer: Self-pay | Admitting: Women's Health

## 2018-07-03 ENCOUNTER — Other Ambulatory Visit: Payer: Self-pay

## 2018-07-03 DIAGNOSIS — N6320 Unspecified lump in the left breast, unspecified quadrant: Secondary | ICD-10-CM

## 2018-11-25 ENCOUNTER — Encounter: Payer: Self-pay | Admitting: Gynecology

## 2018-12-30 ENCOUNTER — Other Ambulatory Visit: Payer: Self-pay

## 2018-12-31 ENCOUNTER — Encounter: Payer: Self-pay | Admitting: Women's Health

## 2018-12-31 ENCOUNTER — Ambulatory Visit (INDEPENDENT_AMBULATORY_CARE_PROVIDER_SITE_OTHER): Payer: BC Managed Care – PPO | Admitting: Women's Health

## 2018-12-31 VITALS — BP 124/80 | Ht 60.0 in | Wt 162.0 lb

## 2018-12-31 DIAGNOSIS — Z01419 Encounter for gynecological examination (general) (routine) without abnormal findings: Secondary | ICD-10-CM

## 2018-12-31 DIAGNOSIS — N951 Menopausal and female climacteric states: Secondary | ICD-10-CM | POA: Diagnosis not present

## 2018-12-31 DIAGNOSIS — Z131 Encounter for screening for diabetes mellitus: Secondary | ICD-10-CM | POA: Diagnosis not present

## 2018-12-31 DIAGNOSIS — Z13220 Encounter for screening for lipoid disorders: Secondary | ICD-10-CM | POA: Diagnosis not present

## 2018-12-31 DIAGNOSIS — Z23 Encounter for immunization: Secondary | ICD-10-CM

## 2018-12-31 LAB — LIPID PANEL
Cholesterol: 173 mg/dL (ref <200)
HDL: 60 mg/dL (ref >=50)
LDL Cholesterol (Calc): 100 mg/dL (calc) — ABNORMAL HIGH
Non-HDL Cholesterol (Calc): 113 mg/dL (calc) (ref <130)
Total CHOL/HDL Ratio: 2.9 (calc) (ref <5.0)
Triglycerides: 52 mg/dL (ref <150)

## 2018-12-31 LAB — CBC WITH DIFFERENTIAL/PLATELET
Absolute Monocytes: 307 cells/uL (ref 200–950)
Basophils Absolute: 29 cells/uL (ref 0–200)
Basophils Relative: 0.7 %
Eosinophils Absolute: 59 cells/uL (ref 15–500)
Eosinophils Relative: 1.4 %
HCT: 39.7 % (ref 35.0–45.0)
Hemoglobin: 13 g/dL (ref 11.7–15.5)
Lymphs Abs: 1772 cells/uL (ref 850–3900)
MCH: 27.8 pg (ref 27.0–33.0)
MCHC: 32.7 g/dL (ref 32.0–36.0)
MCV: 85 fL (ref 80.0–100.0)
MPV: 11.9 fL (ref 7.5–12.5)
Monocytes Relative: 7.3 %
Neutro Abs: 2033 cells/uL (ref 1500–7800)
Neutrophils Relative %: 48.4 %
Platelets: 256 10*3/uL (ref 140–400)
RBC: 4.67 10*6/uL (ref 3.80–5.10)
RDW: 13.6 % (ref 11.0–15.0)
Total Lymphocyte: 42.2 %
WBC: 4.2 10*3/uL (ref 3.8–10.8)

## 2018-12-31 LAB — GLUCOSE, RANDOM: Glucose, Bld: 84 mg/dL (ref 65–99)

## 2018-12-31 MED ORDER — VENLAFAXINE HCL ER 37.5 MG PO CP24: ORAL_CAPSULE | ORAL | 4 refills | Status: DC

## 2018-12-31 NOTE — Patient Instructions (Addendum)
It was nice seeing you today Vitamin D 1000 daily  Carbohydrate Counting for Diabetes Mellitus, Adult  Carbohydrate counting is a method of keeping track of how many carbohydrates you eat. Eating carbohydrates naturally increases the amount of sugar (glucose) in the blood. Counting how many carbohydrates you eat helps keep your blood glucose within normal limits, which helps you manage your diabetes (diabetes mellitus). It is important to know how many carbohydrates you can safely have in each meal. This is different for every person. A diet and nutrition specialist (registered dietitian) can help you make a meal plan and calculate how many carbohydrates you should have at each meal and snack. Carbohydrates are found in the following foods:  Grains, such as breads and cereals.  Dried beans and soy products.  Starchy vegetables, such as potatoes, peas, and corn.  Fruit and fruit juices.  Milk and yogurt.  Sweets and snack foods, such as cake, cookies, candy, chips, and soft drinks. How do I count carbohydrates? There are two ways to count carbohydrates in food. You can use either of the methods or a combination of both. Reading "Nutrition Facts" on packaged food The "Nutrition Facts" list is included on the labels of almost all packaged foods and beverages in the [place] It includes:  The serving size.  Information about nutrients in each serving, including the grams (g) of carbohydrate per serving. To use the "Nutrition Facts":  Decide how many servings you will have.  Multiply the number of servings by the number of carbohydrates per serving.  The resulting number is the total amount of carbohydrates that you will be having. Learning standard serving sizes of other foods When you eat carbohydrate foods that are not packaged or do not include "Nutrition Facts" on the label, you need to measure the servings in order to count the amount of carbohydrates:  Measure the foods that you  will eat with a food scale or measuring cup, if needed.  Decide how many standard-size servings you will eat.  Multiply the number of servings by 15. Most carbohydrate-rich foods have about 15 g of carbohydrates per serving. ? For example, if you eat 8 oz (170 g) of strawberries, you will have eaten 2 servings and 30 g of carbohydrates (2 servings x 15 g = 30 g).  For foods that have more than one food mixed, such as soups and casseroles, you must count the carbohydrates in each food that is included. The following list contains standard serving sizes of common carbohydrate-rich foods. Each of these servings has about 15 g of carbohydrates:   hamburger bun or  English muffin.   oz (15 mL) syrup.   oz (14 g) jelly.  1 slice of bread.  1 six-inch tortilla.  3 oz (85 g) cooked rice or pasta.  4 oz (113 g) cooked dried beans.  4 oz (113 g) starchy vegetable, such as peas, corn, or potatoes.  4 oz (113 g) hot cereal.  4 oz (113 g) mashed potatoes or  of a large baked potato.  4 oz (113 g) canned or frozen fruit.  4 oz (120 mL) fruit juice.  4-6 crackers.  6 chicken nuggets.  6 oz (170 g) unsweetened dry cereal.  6 oz (170 g) plain fat-free yogurt or yogurt sweetened with artificial sweeteners.  8 oz (240 mL) milk.  8 oz (170 g) fresh fruit or one small piece of fruit.  24 oz (680 g) popped popcorn. Example of carbohydrate counting Sample meal  3 oz (85 g) chicken breast.  6 oz (170 g) brown rice.  4 oz (113 g) corn.  8 oz (240 mL) milk.  8 oz (170 g) strawberries with sugar-free whipped topping. Carbohydrate calculation 1. Identify the foods that contain carbohydrates: ? Rice. ? Corn. ? Milk. ? Strawberries. 2. Calculate how many servings you have of each food: ? 2 servings rice. ? 1 serving corn. ? 1 serving milk. ? 1 serving strawberries. 3. Multiply each number of servings by 15 g: ? 2 servings rice x 15 g = 30 g. ? 1 serving corn x 15 g = 15  g. ? 1 serving milk x 15 g = 15 g. ? 1 serving strawberries x 15 g = 15 g. 4. Add together all of the amounts to find the total grams of carbohydrates eaten: ? 30 g + 15 g + 15 g + 15 g = 75 g of carbohydrates total. Summary  Carbohydrate counting is a method of keeping track of how many carbohydrates you eat.  Eating carbohydrates naturally increases the amount of sugar (glucose) in the blood.  Counting how many carbohydrates you eat helps keep your blood glucose within normal limits, which helps you manage your diabetes.  A diet and nutrition specialist (registered dietitian) can help you make a meal plan and calculate how many carbohydrates you should have at each meal and snack. This information is not intended to replace advice given to you by your health care provider. Make sure you discuss any questions you have with your health care provider. Document Released: 02/19/2005 Document Revised: 09/13/2016 Document Reviewed: 08/03/2015 Elsevier Patient Education  2020 [employer].  Health Maintenance, Female Adopting a healthy lifestyle and getting preventive care are important in promoting health and wellness. Ask your health care provider about:  The right schedule for you to have regular tests and exams.  Things you can do on your own to prevent diseases and keep yourself healthy. What should I know about diet, weight, and exercise? Eat a healthy diet   Eat a diet that includes plenty of vegetables, fruits, low-fat dairy products, and lean protein.  Do not eat a lot of foods that are high in solid fats, added sugars, or sodium. Maintain a healthy weight Body mass index (BMI) is used to identify weight problems. It estimates body fat based on height and weight. Your health care provider can help determine your BMI and help you achieve or maintain a healthy weight. Get regular exercise Get regular exercise. This is one of the most important things you can do for your health.  Most adults should:  Exercise for at least 150 minutes each week. The exercise should increase your heart rate and make you sweat (moderate-intensity exercise).  Do strengthening exercises at least twice a week. This is in addition to the moderate-intensity exercise.  Spend less time sitting. Even light physical activity can be beneficial. Watch cholesterol and blood lipids Have your blood tested for lipids and cholesterol at 43 years of age, then have this test every 5 years. Have your cholesterol levels checked more often if:  Your lipid or cholesterol levels are high.  You are older than 43 years of age.  You are at high risk for heart disease. What should I know about cancer screening? Depending on your health history and family history, you may need to have cancer screening at various ages. This may include screening for:  Breast cancer.  Cervical cancer.  Colorectal cancer.  Skin  cancer.  Lung cancer. What should I know about heart disease, diabetes, and high blood pressure? Blood pressure and heart disease  High blood pressure causes heart disease and increases the risk of stroke. This is more likely to develop in people who have high blood pressure readings, are of African descent, or are overweight.  Have your blood pressure checked: ? Every 3-5 years if you are 9-48 years of age. ? Every year if you are 52 years old or older. Diabetes Have regular diabetes screenings. This checks your fasting blood sugar level. Have the screening done:  Once every three years after age 38 if you are at a normal weight and have a low risk for diabetes.  More often and at a younger age if you are overweight or have a high risk for diabetes. What should I know about preventing infection? Hepatitis B If you have a higher risk for hepatitis B, you should be screened for this virus. Talk with your health care provider to find out if you are at risk for hepatitis B infection.  Hepatitis C Testing is recommended for:  Everyone born from [birth year] through 1965.  Anyone with known risk factors for hepatitis C. Sexually transmitted infections (STIs)  Get screened for STIs, including gonorrhea and chlamydia, if: ? You are sexually active and are younger than 43 years of age. ? You are older than 43 years of age and your health care provider tells you that you are at risk for this type of infection. ? Your sexual activity has changed since you were last screened, and you are at increased risk for chlamydia or gonorrhea. Ask your health care provider if you are at risk.  Ask your health care provider about whether you are at high risk for HIV. Your health care provider may recommend a prescription medicine to help prevent HIV infection. If you choose to take medicine to prevent HIV, you should first get tested for HIV. You should then be tested every 3 months for as long as you are taking the medicine. Pregnancy  If you are about to stop having your period (premenopausal) and you may become pregnant, seek counseling before you get pregnant.  Take 400 to 800 micrograms (mcg) of folic acid every day if you become pregnant.  Ask for birth control (contraception) if you want to prevent pregnancy. Osteoporosis and menopause Osteoporosis is a disease in which the bones lose minerals and strength with aging. This can result in bone fractures. If you are 52 years old or older, or if you are at risk for osteoporosis and fractures, ask your health care provider if you should:  Be screened for bone loss.  Take a calcium or vitamin D supplement to lower your risk of fractures.  Be given hormone replacement therapy (HRT) to treat symptoms of menopause. Follow these instructions at home: Lifestyle  Do not use any products that contain nicotine or tobacco, such as cigarettes, e-cigarettes, and chewing tobacco. If you need help quitting, ask your health care provider.  Do not use  street drugs.  Do not share needles.  Ask your health care provider for help if you need support or information about quitting drugs. Alcohol use  Do not drink alcohol if: ? Your health care provider tells you not to drink. ? You are pregnant, may be pregnant, or are planning to become pregnant.  If you drink alcohol: ? Limit how much you use to 0-1 drink a day. ? Limit intake if you are  breastfeeding.  Be aware of how much alcohol is in your drink. In the [place] one drink equals one 12 oz bottle of beer (355 mL), one 5 oz glass of wine (148 mL), or one 1 oz glass of hard liquor (44 mL). General instructions  Schedule regular health, dental, and eye exams.  Stay current with your vaccines.  Tell your health care provider if: ? You often feel depressed. ? You have ever been abused or do not feel safe at home. Summary  Adopting a healthy lifestyle and getting preventive care are important in promoting health and wellness.  Follow your health care provider's instructions about healthy diet, exercising, and getting tested or screened for diseases.  Follow your health care provider's instructions on monitoring your cholesterol and blood pressure. This information is not intended to replace advice given to you by your health care provider. Make sure you discuss any questions you have with your health care provider. Document Released: 09/04/2010 Document Revised: 02/12/2018 Document Reviewed: 02/12/2018 Elsevier Patient Education  2020 [employer]

## 2018-12-31 NOTE — Progress Notes (Signed)
Heather Anderson November 29, 1975 [id number]    History:    Presents for annual exam.  01/2018 Mirena IUD, rare spotting.  Normal Pap and mammogram history, 6 mo follow up.  15 pound weight gain in the past year.  Past medical history, past surgical history, family history and social history were all reviewed and documented in the EPIC chart.  Marriage postponed due to Covid, son is 43 years old doing well.  ROS:  A ROS was performed and pertinent positives and negatives are included.  Exam:  Vitals:   12/31/18 0823  BP: 124/80  Weight: 162 lb (73.5 kg)  Height: 5' (1.524 m)   Body mass index is 31.64 kg/m.   General appearance:  Normal Thyroid:  Symmetrical, normal in size, without palpable masses or nodularity. Respiratory  Auscultation:  Clear without wheezing or rhonchi Cardiovascular  Auscultation:  Regular rate, without rubs, murmurs or gallops  Edema/varicosities:  Not grossly evident Abdominal  Soft,nontender, without masses, guarding or rebound.  Liver/spleen:  No organomegaly noted  Hernia:  None appreciated  Skin  Inspection:  Grossly normal   Breasts: Examined lying and sitting.     Right: Without masses, retractions, discharge or axillary adenopathy.     Left: Without masses, retractions, discharge or axillary adenopathy. Gentitourinary   Inguinal/mons:  Normal without inguinal adenopathy  External genitalia:  Normal  BUS/Urethra/Skene's glands:  Normal  Vagina:  Normal  Cervix:  Normal IUD strings visible  Uterus:  normal in size, shape and contour.  Midline and mobile  Adnexa/parametria:     Rt: Without masses or tenderness.   Lt: Without masses or tenderness.  Anus and perineum: Normal  Digital rectal exam: Normal sphincter tone without palpated masses or tenderness  Assessment/Plan:  43 y.o. engaged BF G3, P1 for annual exam with no complaints.  01/2018 Mirena IUD rare spotting Anxiety/depression stable on Effexor   Plan: Aware of weight gain, encouraged  to increase exercise and decrease calorie/carbs, weight watchers encouraged.  SBEs, annual screening mammogram, calcium rich foods, vitamin D 1000 daily encouraged.  Effexor 37.5 prescription, proper use given and reviewed.  Self-care, leisure activities encouraged.  Counseling as needed.  CBC, glucose, lipid panel, Pap normal 2018, new screening guidelines reviewed.   [place], 8:32 AM 12/31/2018

## 2019-01-05 ENCOUNTER — Other Ambulatory Visit: Payer: Self-pay

## 2019-01-05 ENCOUNTER — Ambulatory Visit
Admission: RE | Admit: 2019-01-05 | Discharge: 2019-01-05 | Disposition: A | Discharge status: Home / Self Care | Payer: BC Managed Care – PPO | Source: Ambulatory Visit | Attending: Women's Health | Admitting: Women's Health

## 2019-01-05 ENCOUNTER — Ambulatory Visit
Admission: RE | Admit: 2019-01-05 | Discharge: 2019-01-05 | Disposition: A | Discharge status: Home / Self Care | Payer: BLUE CROSS/BLUE SHIELD | Source: Ambulatory Visit | Attending: Women's Health | Admitting: Women's Health

## 2019-01-05 DIAGNOSIS — N6320 Unspecified lump in the left breast, unspecified quadrant: Secondary | ICD-10-CM

## 2019-01-05 DIAGNOSIS — R928 Other abnormal and inconclusive findings on diagnostic imaging of breast: Secondary | ICD-10-CM | POA: Diagnosis not present

## 2019-01-05 DIAGNOSIS — N6322 Unspecified lump in the left breast, upper inner quadrant: Secondary | ICD-10-CM | POA: Diagnosis not present

## 2019-03-05 ENCOUNTER — Other Ambulatory Visit: Payer: Self-pay | Admitting: Women's Health

## 2019-04-27 ENCOUNTER — Telehealth: Payer: Self-pay | Admitting: *Deleted

## 2019-04-27 NOTE — Telephone Encounter (Signed)
Patient called c/o white discharge, no itching, no odor, asked if Rx could be sent to pharmacy? I did recommend office visit. Please advise

## 2019-04-27 NOTE — Telephone Encounter (Signed)
Left message detailed message on cell per DPR access.

## 2019-04-27 NOTE — Telephone Encounter (Signed)
Since no odor or itching, best to watch.  Review discharge is normal.

## 2019-06-05 ENCOUNTER — Ambulatory Visit: Payer: Self-pay | Attending: Internal Medicine

## 2019-06-05 DIAGNOSIS — Z23 Encounter for immunization: Secondary | ICD-10-CM

## 2019-06-05 NOTE — Progress Notes (Signed)
   Covid-19 Vaccination Clinic  Name:  TERRIANA BARRERAS    MRN: [id number] DOB: [birth year]  06/05/2019  Ms. Nilson was observed post Covid-19 immunization for 15 minutes without incident. She was provided with Vaccine Information Sheet and instruction to access the V-Safe system.   Ms. Riederer was instructed to call 911 with any severe reactions post vaccine: Marland Kitchen Difficulty breathing  . Swelling of face and throat  . A fast heartbeat  . A bad rash all over body  . Dizziness and weakness   Immunizations Administered    Name Date Dose VIS Date Route   Pfizer COVID-19 Vaccine 06/05/2019 10:27 AM 0.3 mL 02/13/2019 Intramuscular   Manufacturer: [employer], [employer]   Lot: [id number]   NDC: [address]-0

## 2019-06-29 ENCOUNTER — Ambulatory Visit: Payer: Self-pay | Attending: Internal Medicine

## 2019-06-29 DIAGNOSIS — Z23 Encounter for immunization: Secondary | ICD-10-CM

## 2019-06-29 NOTE — Progress Notes (Signed)
   Covid-19 Vaccination Clinic  Name:  SALIMA RUMER    MRN: [id number] DOB: Apr 12, 1975  06/29/2019  Ms. Pacifico was observed post Covid-19 immunization for 15 minutes without incident. She was provided with Vaccine Information Sheet and instruction to access the V-Safe system.   Ms. Kempa was instructed to call 911 with any severe reactions post vaccine: Marland Kitchen Difficulty breathing  . Swelling of face and throat  . A fast heartbeat  . A bad rash all over body  . Dizziness and weakness   Immunizations Administered    Name Date Dose VIS Date Route   Pfizer COVID-19 Vaccine 06/29/2019 12:47 PM 0.3 mL 04/29/2018 Intramuscular   Manufacturer: [employer], [employer]   Lot: [id number]   NDC: [address]-3

## 2019-08-07 ENCOUNTER — Other Ambulatory Visit: Payer: Self-pay

## 2019-08-07 ENCOUNTER — Emergency Department (HOSPITAL_COMMUNITY)
Admission: EM | Admit: 2019-08-07 | Discharge: 2019-08-07 | Disposition: A | Discharge status: Home / Self Care | Payer: BC Managed Care – PPO | Attending: Emergency Medicine | Admitting: Emergency Medicine

## 2019-08-07 ENCOUNTER — Encounter (HOSPITAL_COMMUNITY): Payer: Self-pay

## 2019-08-07 ENCOUNTER — Emergency Department (HOSPITAL_COMMUNITY): Payer: BC Managed Care – PPO

## 2019-08-07 DIAGNOSIS — N200 Calculus of kidney: Secondary | ICD-10-CM

## 2019-08-07 DIAGNOSIS — K921 Melena: Secondary | ICD-10-CM | POA: Diagnosis not present

## 2019-08-07 DIAGNOSIS — R109 Unspecified abdominal pain: Secondary | ICD-10-CM | POA: Diagnosis not present

## 2019-08-07 LAB — CBC
HCT: 43.4 % (ref 36.0–46.0)
Hemoglobin: 13.5 g/dL (ref 12.0–15.0)
MCH: 27.6 pg (ref 26.0–34.0)
MCHC: 31.1 g/dL (ref 30.0–36.0)
MCV: 88.6 fL (ref 80.0–100.0)
Platelets: 235 10*3/uL (ref 150–400)
RBC: 4.9 MIL/uL (ref 3.87–5.11)
RDW: 13.6 % (ref 11.5–15.5)
WBC: 8.2 10*3/uL (ref 4.0–10.5)
nRBC: 0 % (ref 0.0–0.2)

## 2019-08-07 LAB — COMPREHENSIVE METABOLIC PANEL
ALT: 24 U/L (ref 0–44)
AST: 24 U/L (ref 15–41)
Albumin: 4.1 g/dL (ref 3.5–5.0)
Alkaline Phosphatase: 61 U/L (ref 38–126)
Anion gap: 11 (ref 5–15)
BUN: 15 mg/dL (ref 6–20)
CO2: 24 mmol/L (ref 22–32)
Calcium: 8.7 mg/dL — ABNORMAL LOW (ref 8.9–10.3)
Chloride: 106 mmol/L (ref 98–111)
Creatinine, Ser: 0.86 mg/dL (ref 0.44–1.00)
GFR calc Af Amer: >60 mL/min (ref >60)
GFR calc non Af Amer: >60 mL/min (ref >60)
Glucose, Bld: 139 mg/dL — ABNORMAL HIGH (ref 70–99)
Potassium: 4 mmol/L (ref 3.5–5.1)
Sodium: 141 mmol/L (ref 135–145)
Total Bilirubin: 0.5 mg/dL (ref 0.3–1.2)
Total Protein: 7.7 g/dL (ref 6.5–8.1)

## 2019-08-07 LAB — TYPE AND SCREEN
ABO/RH(D): A POS
Antibody Screen: NEGATIVE

## 2019-08-07 LAB — I-STAT BETA HCG BLOOD, ED (MC, WL, AP ONLY): I-stat hCG, quantitative: <5 m[IU]/mL (ref <5)

## 2019-08-07 LAB — URINALYSIS, ROUTINE W REFLEX MICROSCOPIC
Bacteria, UA: NONE SEEN
Bilirubin Urine: NEGATIVE
Glucose, UA: NEGATIVE mg/dL
Ketones, ur: 5 mg/dL — AB
Nitrite: NEGATIVE
Protein, ur: NEGATIVE mg/dL
Specific Gravity, Urine: 1.019 (ref 1.005–1.030)
pH: 8 (ref 5.0–8.0)

## 2019-08-07 LAB — ABO/RH: ABO/RH(D): A POS

## 2019-08-07 MED ORDER — SODIUM CHLORIDE 0.9 % IV BOLUS
1000.0000 mL | Freq: Once | INTRAVENOUS | Status: AC
  Administered 2019-08-07: 1000 mL via INTRAVENOUS

## 2019-08-07 MED ORDER — IOHEXOL 300 MG/ML  SOLN
100.0000 mL | Freq: Once | INTRAMUSCULAR | Status: AC | PRN
  Administered 2019-08-07: 100 mL via INTRAVENOUS

## 2019-08-07 MED ORDER — HYDROMORPHONE HCL 1 MG/ML IJ SOLN
0.5000 mg | Freq: Once | INTRAMUSCULAR | Status: AC
  Administered 2019-08-07: 0.5 mg via INTRAVENOUS
  Filled 2019-08-07: qty 1

## 2019-08-07 MED ORDER — ONDANSETRON 4 MG PO TBDP
4.0000 mg | ORAL_TABLET | Freq: Three times a day (TID) | ORAL | 0 refills | Status: DC | PRN
Start: 2019-08-07 — End: 2020-01-04

## 2019-08-07 MED ORDER — HYDROCODONE-ACETAMINOPHEN 5-325 MG PO TABS: 1.0000 | ORAL_TABLET | Freq: Four times a day (QID) | ORAL | 0 refills | Status: DC | PRN

## 2019-08-07 MED ORDER — ONDANSETRON HCL 4 MG/2ML IJ SOLN
4.0000 mg | Freq: Once | INTRAMUSCULAR | Status: AC
  Administered 2019-08-07: 4 mg via INTRAVENOUS
  Filled 2019-08-07: qty 2

## 2019-08-07 MED ORDER — KETOROLAC TROMETHAMINE 30 MG/ML IJ SOLN
15.0000 mg | Freq: Once | INTRAMUSCULAR | Status: AC
  Administered 2019-08-07: 15 mg via INTRAVENOUS
  Filled 2019-08-07: qty 1

## 2019-08-07 MED ORDER — SODIUM CHLORIDE (PF) 0.9 % IJ SOLN
INTRAMUSCULAR | Status: AC
  Filled 2019-08-07: qty 50

## 2019-08-07 MED ORDER — FENTANYL CITRATE (PF) 100 MCG/2ML IJ SOLN
50.0000 ug | Freq: Once | INTRAMUSCULAR | Status: AC
  Administered 2019-08-07: 50 ug via INTRAVENOUS
  Filled 2019-08-07: qty 2

## 2019-08-07 NOTE — ED Triage Notes (Signed)
Patient c/o right flank pain, emesis, and blood in her stool since waking this AM.

## 2019-08-07 NOTE — ED Provider Notes (Signed)
[facility] DEPT Provider Note   CSN: [id number] Arrival date & time: 08/07/19  0747     History Chief Complaint  Patient presents with  . Flank Pain  . Emesis  . Blood In Stools    Heather Anderson is a 44 y.o. female.  HPI Patient presents with right-sided flank/abdominal pain.  Began last night but is worsened throughout the day.  Difficulty finding a comfortable position.  No dysuria.  States she had had some nausea and vomiting.  Has had some dry heaves.  States she did have a little bit of blood when she went to the bathroom.  No diarrhea.  States it was normal stool but had some blood with it.  Denies vaginal bleeding or discharge.  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  Pain may move from right flank down to right abdomen some.  Family numbers have had kidney stones but she never has.    History reviewed. No pertinent past medical history.  Patient Active Problem List   Diagnosis Date Noted  . Bacterial vaginosis 12/19/2016  . Lymphadenopathy, inguinal 05/10/2016  . PMDD (premenstrual dysphoric disorder) 12/25/2013  . Menstrual migraine with status migrainosus, not intractable 12/25/2013    Past Surgical History:  Procedure Laterality Date  . INTRAUTERINE DEVICE INSERTION  01/2018   Mirena  . WISDOM TOOTH EXTRACTION       OB History    Gravida  3   Para  1   Term  1   Preterm      AB  2   Living  1     SAB  2   TAB      Ectopic      Multiple      Live Births  1           Family History  Problem Relation Age of Onset  . Breast cancer Maternal Aunt 47  . Diabetes Maternal Aunt   . Urolithiasis Mother     Social History   Tobacco Use  . Smoking status: Never Smoker  . Smokeless tobacco: Never Used  Substance Use Topics  . Alcohol use: Yes    Alcohol/week: 0.0 standard drinks    Comment: RARELY- occassionaly  . Drug use: No    Home Medications Prior to Admission medications   Medication Sig Start Date  End Date Taking? Authorizing Provider  levonorgestrel (MIRENA) 20 MCG/24HR IUD 1 each by Intrauterine route once.   Yes [provider]  Multiple Vitamins-Calcium (ONE-A-DAY WOMENS FORMULA PO) Take 1 tablet by mouth daily.   Yes [provider]  venlafaxine XR (EFFEXOR-XR) 37.5 MG 24 hr capsule TAKE 1 CAPSULE ONCE DAILY WITH BREAKFAST. Patient taking differently: Take 37.5 mg by mouth daily.  12/31/18  Yes Huel Cote, NP  HYDROcodone-acetaminophen (NORCO/VICODIN) 5-325 MG tablet Take 1-2 tablets by mouth every 6 (six) hours as needed. 08/07/19   Davonna Belling, MD  metroNIDAZOLE (METROGEL VAGINAL) 0.75 % vaginal gel 1 APPLICATOR at bedtime for 5 nights and then weekly Patient not taking: Reported on 08/07/2019 02/17/18   Huel Cote, NP  ondansetron (ZOFRAN-ODT) 4 MG disintegrating tablet Take 1 tablet (4 mg total) by mouth every 8 (eight) hours as needed for nausea or vomiting. 08/07/19   Davonna Belling, MD    Allergies    Patient has no known allergies.  Review of Systems   Review of Systems  Constitutional: Positive for appetite change.  HENT: Negative for congestion.   Respiratory:  Negative for shortness of breath.   Gastrointestinal: Positive for abdominal pain, blood in stool, nausea and vomiting. Negative for constipation and diarrhea.  Genitourinary: Positive for flank pain. Negative for dysuria, vaginal bleeding and vaginal discharge.  Musculoskeletal: Positive for back pain.  Skin: Negative for rash.  Neurological: Negative for weakness.    Physical Exam Updated Vital Signs BP 131/87 (BP Location: Left Arm)   Pulse 83   Temp 98.5 F (36.9 C) (Oral)   Resp 16   Ht 5' (1.524 m)   Wt 68 kg   LMP 07/13/2019 (Approximate)   SpO2 100%   BMI 29.29 kg/m   Physical Exam Vitals and nursing note reviewed.  HENT:     Head: Normocephalic.  Eyes:     Pupils: Pupils are equal, round, and reactive to light.  Cardiovascular:     Rate and Rhythm:  Regular rhythm.  Abdominal:     Tenderness: There is abdominal tenderness.     Comments: Right mid upper abdominal tenderness.  No CVA tenderness.  No rebound or guarding.  No hernia.  Genitourinary:    Comments: No CVA tenderness. Musculoskeletal:        General: No tenderness.     Cervical back: Neck supple.  Skin:    General: Skin is warm.     Capillary Refill: Capillary refill takes less than 2 seconds.  Neurological:     Mental Status: She is alert and oriented to person, place, and time.     ED Results / Procedures / Treatments   Labs (all labs ordered are listed, but only abnormal results are displayed) Labs Reviewed  COMPREHENSIVE METABOLIC PANEL - Abnormal; Notable for the following components:      Result Value   Glucose, Bld 139 (*)    Calcium 8.7 (*)    All other components within normal limits  URINALYSIS, ROUTINE W REFLEX MICROSCOPIC - Abnormal; Notable for the following components:   Color, Urine YELLOW (*)    Hgb urine dipstick LARGE (*)    Ketones, ur 5 (*)    Leukocytes,Ua TRACE (*)    All other components within normal limits  CBC  I-STAT BETA HCG BLOOD, ED ([facility], [facility], [facility] ONLY)  POC OCCULT BLOOD, ED  TYPE AND SCREEN  ABO/RH    EKG None  Radiology CT ABDOMEN PELVIS W CONTRAST  Result Date: 08/07/2019 CLINICAL DATA:  Mid abdominal pain.  Blood in stool. EXAM: CT ABDOMEN AND PELVIS WITH CONTRAST TECHNIQUE: Multidetector CT imaging of the abdomen and pelvis was performed using the standard protocol following bolus administration of intravenous contrast. CONTRAST:  OMNIPAQUE IOHEXOL 300 MG/ML  SOLN COMPARISON:  None. FINDINGS: Lower chest:  No contributory findings. Hepatobiliary: No focal liver abnormality.No evidence of biliary obstruction or stone. Pancreas: Unremarkable. Spleen: Unremarkable. Adrenals/Urinary Tract: Negative adrenals. Right hydroureteronephrosis due to a 4 mm stone at the UVJ. There is associated delayed right renal excretion. On the  early phase there is already excretion in the left renal collecting system, limiting detection of left-sided calculi. Stomach/Bowel:  No obstruction. No appendicitis. Vascular/Lymphatic: No acute vascular abnormality. No mass or adenopathy. Reproductive:IUD in expected position. Other: No ascites or pneumoperitoneum. Musculoskeletal: No acute abnormalities. L5-S1 focal degenerative disc disease. IMPRESSION: Obstructing 4 mm right UVJ calculus. Electronically Signed   By: Marnee Spring M.D.   On: 08/07/2019 11:51    Procedures Procedures (including critical care time)  Medications Ordered in ED Medications  ondansetron (ZOFRAN) injection 4 mg (4 mg Intravenous Given 08/07/19  4970)  fentaNYL (SUBLIMAZE) injection 50 mcg (50 mcg Intravenous Given 08/07/19 0954)  sodium chloride 0.9 % bolus 1,000 mL (0 mLs Intravenous Stopped 08/07/19 1334)  HYDROmorphone (DILAUDID) injection 0.5 mg (0.5 mg Intravenous Given 08/07/19 1103)  iohexol (OMNIPAQUE) 300 MG/ML solution 100 mL (100 mLs Intravenous Contrast Given 08/07/19 1110)  sodium chloride (PF) 0.9 % injection (  Given by Other 08/07/19 1106)  ketorolac (TORADOL) 30 MG/ML injection 15 mg (15 mg Intravenous Given 08/07/19 1230)    ED Course  I have reviewed the triage vital signs and the nursing notes.  Pertinent labs & imaging results that were available during my care of the patient were reviewed by me and considered in my medical decision making (see chart for details).    MDM Rules/Calculators/A&P                      Patient with right flank pain.  Emesis.  CT scan shows kidney stone.  I think likely cause of pain.  Questionable blood in the stool which will need to be followed as outpatient.  Feels better.  Discharge home. Final Clinical Impression(s) / ED Diagnoses Final diagnoses:  Kidney stone    Rx / DC Orders ED Discharge Orders         Ordered    ondansetron (ZOFRAN-ODT) 4 MG disintegrating tablet  Every 8 hours PRN     08/07/19 1333     HYDROcodone-acetaminophen (NORCO/VICODIN) 5-325 MG tablet  Every 6 hours PRN     08/07/19 1333           Benjiman Core, MD 08/07/19 1556

## 2019-08-07 NOTE — ED Notes (Signed)
PT c/o pain during orthostatic vitals.

## 2019-08-09 ENCOUNTER — Telehealth: Payer: Self-pay | Admitting: Obstetrics and Gynecology

## 2019-08-09 NOTE — Telephone Encounter (Signed)
Patient calling after hours.   Stats she was diagnosed with kidney stones on CT scan after visit to the ER department.   She is having pain and nausea, and she is running out of antinausea and pain medication.   She wants refills of both.   She thinks she also may have had blood in her stool prior to her ER visit.   She had not had any additional bowel movement to know if this continues.   I recommend she return to the ER for an ongoing symptoms.  She states she may try to wait until the morning and call [facility] in follow up with them.

## 2019-08-10 ENCOUNTER — Other Ambulatory Visit: Payer: Self-pay

## 2019-08-10 DIAGNOSIS — N201 Calculus of ureter: Secondary | ICD-10-CM | POA: Diagnosis not present

## 2019-08-12 DIAGNOSIS — N201 Calculus of ureter: Secondary | ICD-10-CM | POA: Diagnosis not present

## 2019-08-12 DIAGNOSIS — N23 Unspecified renal colic: Secondary | ICD-10-CM | POA: Diagnosis not present

## 2019-08-17 DIAGNOSIS — N23 Unspecified renal colic: Secondary | ICD-10-CM | POA: Diagnosis not present

## 2019-08-17 DIAGNOSIS — N201 Calculus of ureter: Secondary | ICD-10-CM | POA: Diagnosis not present

## 2019-11-24 ENCOUNTER — Other Ambulatory Visit: Payer: Self-pay | Admitting: Nurse Practitioner

## 2019-11-24 DIAGNOSIS — N6320 Unspecified lump in the left breast, unspecified quadrant: Secondary | ICD-10-CM

## 2020-01-04 ENCOUNTER — Other Ambulatory Visit: Payer: Self-pay

## 2020-01-04 ENCOUNTER — Encounter: Payer: Self-pay | Admitting: Nurse Practitioner

## 2020-01-04 ENCOUNTER — Ambulatory Visit (INDEPENDENT_AMBULATORY_CARE_PROVIDER_SITE_OTHER): Payer: BC Managed Care – PPO | Admitting: Nurse Practitioner

## 2020-01-04 VITALS — BP 110/78 | Ht 60.0 in | Wt 151.0 lb

## 2020-01-04 DIAGNOSIS — Z23 Encounter for immunization: Secondary | ICD-10-CM | POA: Diagnosis not present

## 2020-01-04 DIAGNOSIS — Z01419 Encounter for gynecological examination (general) (routine) without abnormal findings: Secondary | ICD-10-CM

## 2020-01-04 DIAGNOSIS — Z30431 Encounter for routine checking of intrauterine contraceptive device: Secondary | ICD-10-CM | POA: Diagnosis not present

## 2020-01-04 DIAGNOSIS — Z13220 Encounter for screening for lipoid disorders: Secondary | ICD-10-CM | POA: Diagnosis not present

## 2020-01-04 NOTE — Addendum Note (Signed)
Addended by: Tito Dine on: 01/04/2020 09:35 AM   Modules accepted: Orders

## 2020-01-04 NOTE — Patient Instructions (Signed)
Health Maintenance, Female Adopting a healthy lifestyle and getting preventive care are important in promoting health and wellness. Ask your health care provider about:  The right schedule for you to have regular tests and exams.  Things you can do on your own to prevent diseases and keep yourself healthy. What should I know about diet, weight, and exercise? Eat a healthy diet   Eat a diet that includes plenty of vegetables, fruits, low-fat dairy products, and lean protein.  Do not eat a lot of foods that are high in solid fats, added sugars, or sodium. Maintain a healthy weight Body mass index (BMI) is used to identify weight problems. It estimates body fat based on height and weight. Your health care provider can help determine your BMI and help you achieve or maintain a healthy weight. Get regular exercise Get regular exercise. This is one of the most important things you can do for your health. Most adults should:  Exercise for at least 150 minutes each week. The exercise should increase your heart rate and make you sweat (moderate-intensity exercise).  Do strengthening exercises at least twice a week. This is in addition to the moderate-intensity exercise.  Spend less time sitting. Even light physical activity can be beneficial. Watch cholesterol and blood lipids Have your blood tested for lipids and cholesterol at 44 years of age, then have this test every 5 years. Have your cholesterol levels checked more often if:  Your lipid or cholesterol levels are high.  You are older than 44 years of age.  You are at high risk for heart disease. What should I know about cancer screening? Depending on your health history and family history, you may need to have cancer screening at various ages. This may include screening for:  Breast cancer.  Cervical cancer.  Colorectal cancer.  Skin cancer.  Lung cancer. What should I know about heart disease, diabetes, and high blood  pressure? Blood pressure and heart disease  High blood pressure causes heart disease and increases the risk of stroke. This is more likely to develop in people who have high blood pressure readings, are of African descent, or are overweight.  Have your blood pressure checked: ? Every 3-5 years if you are 18-39 years of age. ? Every year if you are 40 years old or older. Diabetes Have regular diabetes screenings. This checks your fasting blood sugar level. Have the screening done:  Once every three years after age 40 if you are at a normal weight and have a low risk for diabetes.  More often and at a younger age if you are overweight or have a high risk for diabetes. What should I know about preventing infection? Hepatitis B If you have a higher risk for hepatitis B, you should be screened for this virus. Talk with your health care provider to find out if you are at risk for hepatitis B infection. Hepatitis C Testing is recommended for:  Everyone born from 1945 through 1965.  Anyone with known risk factors for hepatitis C. Sexually transmitted infections (STIs)  Get screened for STIs, including gonorrhea and chlamydia, if: ? You are sexually active and are younger than 44 years of age. ? You are older than 44 years of age and your health care provider tells you that you are at risk for this type of infection. ? Your sexual activity has changed since you were last screened, and you are at increased risk for chlamydia or gonorrhea. Ask your health care provider if   you are at risk.  Ask your health care provider about whether you are at high risk for HIV. Your health care provider may recommend a prescription medicine to help prevent HIV infection. If you choose to take medicine to prevent HIV, you should first get tested for HIV. You should then be tested every 3 months for as long as you are taking the medicine. Pregnancy  If you are about to stop having your period (premenopausal) and  you may become pregnant, seek counseling before you get pregnant.  Take 400 to 800 micrograms (mcg) of folic acid every day if you become pregnant.  Ask for birth control (contraception) if you want to prevent pregnancy. Osteoporosis and menopause Osteoporosis is a disease in which the bones lose minerals and strength with aging. This can result in bone fractures. If you are 65 years old or older, or if you are at risk for osteoporosis and fractures, ask your health care provider if you should:  Be screened for bone loss.  Take a calcium or vitamin D supplement to lower your risk of fractures.  Be given hormone replacement therapy (HRT) to treat symptoms of menopause. Follow these instructions at home: Lifestyle  Do not use any products that contain nicotine or tobacco, such as cigarettes, e-cigarettes, and chewing tobacco. If you need help quitting, ask your health care provider.  Do not use street drugs.  Do not share needles.  Ask your health care provider for help if you need support or information about quitting drugs. Alcohol use  Do not drink alcohol if: ? Your health care provider tells you not to drink. ? You are pregnant, may be pregnant, or are planning to become pregnant.  If you drink alcohol: ? Limit how much you use to 0-1 drink a day. ? Limit intake if you are breastfeeding.  Be aware of how much alcohol is in your drink. In the [place] one drink equals one 12 oz bottle of beer (355 mL), one 5 oz glass of wine (148 mL), or one 1 oz glass of hard liquor (44 mL). General instructions  Schedule regular health, dental, and eye exams.  Stay current with your vaccines.  Tell your health care provider if: ? You often feel depressed. ? You have ever been abused or do not feel safe at home. Summary  Adopting a healthy lifestyle and getting preventive care are important in promoting health and wellness.  Follow your health care provider's instructions about healthy  diet, exercising, and getting tested or screened for diseases.  Follow your health care provider's instructions on monitoring your cholesterol and blood pressure. This information is not intended to replace advice given to you by your health care provider. Make sure you discuss any questions you have with your health care provider. Document Revised: 02/12/2018 Document Reviewed: 02/12/2018 Elsevier Patient Education  2020 Elsevier Inc.  

## 2020-01-04 NOTE — Progress Notes (Addendum)
   PHILIP ECKERSLEY Apr 09, 1975 [id number]   History:  44 y.o. [id number] presents for annual exam without GYN complaints. Amenorrheic on Mirena IUD inserted 01/2018. Normal pap and mammogram history. Has started Optavia for weight loss and is doing well on it.   Gynecologic History Patient's last menstrual period was 11/09/2019. Period Pattern: (!) Irregular Menstrual Flow: Moderate Menstrual Control: Maxi pad, Tampon Dysmenorrhea: (!) Mild Dysmenorrhea Symptoms: Cramping Contraception: IUD Last Pap: 12/19/2016. Results were: normal Last mammogram: 01/05/2019. Results were: 3 stable benign masses of left breast  Past medical history, past surgical history, family history and social history were all reviewed and documented in the EPIC chart.  ROS:  A ROS was performed and pertinent positives and negatives are included.  Exam:  Vitals:   01/04/20 0835  BP: 110/78  Weight: 151 lb (68.5 kg)  Height: 5' (1.524 m)   Body mass index is 29.49 kg/m.  General appearance:  Normal Thyroid:  Symmetrical, normal in size, without palpable masses or nodularity. Respiratory  Auscultation:  Clear without wheezing or rhonchi Cardiovascular  Auscultation:  Regular rate, without rubs, murmurs or gallops  Edema/varicosities:  Not grossly evident Abdominal  Soft,nontender, without masses, guarding or rebound.  Liver/spleen:  No organomegaly noted  Hernia:  None appreciated  Skin  Inspection:  Grossly normal   Breasts: Examined lying and sitting.   Right: Without masses, retractions, discharge or axillary adenopathy.   Left: Without masses, retractions, discharge or axillary adenopathy. Gentitourinary   Inguinal/mons:  Normal without inguinal adenopathy  External genitalia:  Normal  BUS/Urethra/Skene's glands:  Normal  Vagina:  Normal  Cervix:  Normal, IUD string visible  Uterus:  Normal in size, shape and contour.  Midline and mobile  Adnexa/parametria:     Rt: Without masses or  tenderness.   Lt: Without masses or tenderness.  Anus and perineum: Normal  Assessment/Plan:  44 y.o. [id number] for annual exam.   Well female exam with routine gynecological exam - Plan: CBC with Differential/Platelet, Comprehensive metabolic panel, Lipid panel. Education provided on SBEs, importance of preventative screenings, current guidelines, high calcium diet, regular exercise, and multivitamin daily.   Encounter for routine checking of intrauterine contraceptive device (IUD) - Mirena inserted 01/2018. Amenorrheic. IUD string visible on exam.   Screening for cervical cancer -normal Pap history.  Pap with reflex today.  Screening for breast cancer -normal mammogram history.  History stable benign masses on left breast.  Continue annual screenings.  Next mammogram scheduled 02/02/2020.  Screening for colon cancer -no risk factors.  Will start screening colonoscopies at age 28.  Need for immunization against influenza - Plan: Flu Vaccine QUAD 36+ mos IM  Follow-up in 1 year for annual.      [name] [facility], 8:57 AM 01/04/2020

## 2020-01-05 LAB — CBC WITH DIFFERENTIAL/PLATELET
Absolute Monocytes: 317 cells/uL (ref 200–950)
Basophils Absolute: 40 cells/uL (ref 0–200)
Basophils Relative: 1.1 %
Eosinophils Absolute: 68 cells/uL (ref 15–500)
Eosinophils Relative: 1.9 %
HCT: 41.9 % (ref 35.0–45.0)
Hemoglobin: 13.7 g/dL (ref 11.7–15.5)
Lymphs Abs: 1710 cells/uL (ref 850–3900)
MCH: 28 pg (ref 27.0–33.0)
MCHC: 32.7 g/dL (ref 32.0–36.0)
MCV: 85.7 fL (ref 80.0–100.0)
MPV: 12 fL (ref 7.5–12.5)
Monocytes Relative: 8.8 %
Neutro Abs: 1465 cells/uL — ABNORMAL LOW (ref 1500–7800)
Neutrophils Relative %: 40.7 %
Platelets: 235 10*3/uL (ref 140–400)
RBC: 4.89 10*6/uL (ref 3.80–5.10)
RDW: 13 % (ref 11.0–15.0)
Total Lymphocyte: 47.5 %
WBC: 3.6 10*3/uL — ABNORMAL LOW (ref 3.8–10.8)

## 2020-01-05 LAB — LIPID PANEL
Cholesterol: 154 mg/dL (ref <200)
HDL: 54 mg/dL (ref >=50)
LDL Cholesterol (Calc): 87 mg/dL (calc)
Non-HDL Cholesterol (Calc): 100 mg/dL (calc) (ref <130)
Total CHOL/HDL Ratio: 2.9 (calc) (ref <5.0)
Triglycerides: 45 mg/dL (ref <150)

## 2020-01-05 LAB — COMPREHENSIVE METABOLIC PANEL
AG Ratio: 1.4 (calc) (ref 1.0–2.5)
ALT: 13 U/L (ref 6–29)
AST: 17 U/L (ref 10–30)
Albumin: 4.1 g/dL (ref 3.6–5.1)
Alkaline phosphatase (APISO): 67 U/L (ref 31–125)
BUN: 16 mg/dL (ref 7–25)
CO2: 25 mmol/L (ref 20–32)
Calcium: 9.1 mg/dL (ref 8.6–10.2)
Chloride: 102 mmol/L (ref 98–110)
Creat: 0.82 mg/dL (ref 0.50–1.10)
Globulin: 3 g/dL (calc) (ref 1.9–3.7)
Glucose, Bld: 84 mg/dL (ref 65–99)
Potassium: 4.5 mmol/L (ref 3.5–5.3)
Sodium: 136 mmol/L (ref 135–146)
Total Bilirubin: 0.4 mg/dL (ref 0.2–1.2)
Total Protein: 7.1 g/dL (ref 6.1–8.1)

## 2020-01-05 LAB — PAP IG W/ RFLX HPV ASCU

## 2020-01-06 ENCOUNTER — Other Ambulatory Visit: Payer: BC Managed Care – PPO

## 2020-05-12 ENCOUNTER — Other Ambulatory Visit: Payer: Self-pay

## 2020-05-12 DIAGNOSIS — N951 Menopausal and female climacteric states: Secondary | ICD-10-CM

## 2020-05-12 MED ORDER — VENLAFAXINE HCL ER 37.5 MG PO CP24: 37.5000 mg | ORAL_CAPSULE | Freq: Every day | ORAL | 8 refills | Status: DC

## 2020-05-12 NOTE — Telephone Encounter (Signed)
Medication refill request: Venlafaxine HCL ER 37.5mg  Last AEX:  01-04-2020 Next AEX: 01-04-2021 Last MMG (if hormonal medication request): n/a Refill authorized: please approve if appropriate

## 2020-08-17 DIAGNOSIS — N201 Calculus of ureter: Secondary | ICD-10-CM | POA: Diagnosis not present

## 2020-12-19 ENCOUNTER — Other Ambulatory Visit: Payer: Self-pay | Admitting: Nurse Practitioner

## 2020-12-19 DIAGNOSIS — N6320 Unspecified lump in the left breast, unspecified quadrant: Secondary | ICD-10-CM

## 2021-01-04 ENCOUNTER — Ambulatory Visit (INDEPENDENT_AMBULATORY_CARE_PROVIDER_SITE_OTHER): Payer: BC Managed Care – PPO | Admitting: Nurse Practitioner

## 2021-01-04 ENCOUNTER — Encounter: Payer: Self-pay | Admitting: Nurse Practitioner

## 2021-01-04 ENCOUNTER — Other Ambulatory Visit: Payer: Self-pay

## 2021-01-04 VITALS — BP 138/86 | Ht 60.0 in | Wt 149.0 lb

## 2021-01-04 DIAGNOSIS — Z23 Encounter for immunization: Secondary | ICD-10-CM | POA: Diagnosis not present

## 2021-01-04 DIAGNOSIS — F419 Anxiety disorder, unspecified: Secondary | ICD-10-CM

## 2021-01-04 DIAGNOSIS — Z30431 Encounter for routine checking of intrauterine contraceptive device: Secondary | ICD-10-CM | POA: Diagnosis not present

## 2021-01-04 DIAGNOSIS — M9905 Segmental and somatic dysfunction of pelvic region: Secondary | ICD-10-CM | POA: Diagnosis not present

## 2021-01-04 DIAGNOSIS — R232 Flushing: Secondary | ICD-10-CM

## 2021-01-04 DIAGNOSIS — M9903 Segmental and somatic dysfunction of lumbar region: Secondary | ICD-10-CM | POA: Diagnosis not present

## 2021-01-04 DIAGNOSIS — Z01419 Encounter for gynecological examination (general) (routine) without abnormal findings: Secondary | ICD-10-CM

## 2021-01-04 DIAGNOSIS — M5386 Other specified dorsopathies, lumbar region: Secondary | ICD-10-CM | POA: Diagnosis not present

## 2021-01-04 DIAGNOSIS — M9902 Segmental and somatic dysfunction of thoracic region: Secondary | ICD-10-CM | POA: Diagnosis not present

## 2021-01-04 LAB — COMPREHENSIVE METABOLIC PANEL
AG Ratio: 1.5 (calc) (ref 1.0–2.5)
ALT: 13 U/L (ref 6–29)
AST: 16 U/L (ref 10–35)
Albumin: 4.4 g/dL (ref 3.6–5.1)
Alkaline phosphatase (APISO): 68 U/L (ref 31–125)
BUN: 14 mg/dL (ref 7–25)
CO2: 30 mmol/L (ref 20–32)
Calcium: 9.7 mg/dL (ref 8.6–10.2)
Chloride: 104 mmol/L (ref 98–110)
Creat: 0.77 mg/dL (ref 0.50–0.99)
Globulin: 3 g/dL (calc) (ref 1.9–3.7)
Glucose, Bld: 91 mg/dL (ref 65–99)
Potassium: 5.5 mmol/L — ABNORMAL HIGH (ref 3.5–5.3)
Sodium: 140 mmol/L (ref 135–146)
Total Bilirubin: 0.4 mg/dL (ref 0.2–1.2)
Total Protein: 7.4 g/dL (ref 6.1–8.1)

## 2021-01-04 LAB — CBC WITH DIFFERENTIAL/PLATELET
Absolute Monocytes: 353 cells/uL (ref 200–950)
Basophils Absolute: 41 cells/uL (ref 0–200)
Basophils Relative: 1 %
Eosinophils Absolute: 131 cells/uL (ref 15–500)
Eosinophils Relative: 3.2 %
HCT: 42.1 % (ref 35.0–45.0)
Hemoglobin: 13.7 g/dL (ref 11.7–15.5)
Lymphs Abs: 1796 cells/uL (ref 850–3900)
MCH: 27.6 pg (ref 27.0–33.0)
MCHC: 32.5 g/dL (ref 32.0–36.0)
MCV: 84.9 fL (ref 80.0–100.0)
MPV: 11.8 fL (ref 7.5–12.5)
Monocytes Relative: 8.6 %
Neutro Abs: 1779 cells/uL (ref 1500–7800)
Neutrophils Relative %: 43.4 %
Platelets: 256 10*3/uL (ref 140–400)
RBC: 4.96 10*6/uL (ref 3.80–5.10)
RDW: 12.9 % (ref 11.0–15.0)
Total Lymphocyte: 43.8 %
WBC: 4.1 10*3/uL (ref 3.8–10.8)

## 2021-01-04 MED ORDER — VENLAFAXINE HCL ER 37.5 MG PO CP24: 37.5000 mg | ORAL_CAPSULE | Freq: Every day | ORAL | 3 refills | Status: DC

## 2021-01-04 NOTE — Progress Notes (Signed)
   Heather Anderson [birth year] [id number]   History:  45 y.o. [id number] presents for annual exam without GYN complaints. Amenorrheic/Mirena IUD inserted 01/2018. Normal pap and mammogram history. She has had increased work-related stress.  Gynecologic History No LMP recorded.   Contraception/Family planning: IUD Sexually active: Yes  Health Maintenance Last Pap: 01/04/2020. Results were: Normal, 3-year repeat Last mammogram: 01/05/2019. Results were: 3 stable benign masses of left breast. Scheduled 01/24/2021 Last colonoscopy: Never Last Dexa: Not indicated  Past medical history, past surgical history, family history and social history were all reviewed and documented in the EPIC chart. Married. Works in Community education officer. 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 Maternal aunt diagnosed with breast cancer at age 5.   ROS:  A ROS was performed and pertinent positives and negatives are included.  Exam:  Vitals:   01/04/21 0830  BP: 138/86  Weight: 149 lb (67.6 kg)  Height: 5' (1.524 m)    Body mass index is 29.1 kg/m.  General appearance:  Normal Thyroid:  Symmetrical, normal in size, without palpable masses or nodularity. Respiratory  Auscultation:  Clear without wheezing or rhonchi Cardiovascular  Auscultation:  Regular rate, without rubs, murmurs or gallops  Edema/varicosities:  Not grossly evident Abdominal  Soft,nontender, without masses, guarding or rebound.  Liver/spleen:  No organomegaly noted  Hernia:  None appreciated  Skin  Inspection:  Grossly normal   Breasts: Examined lying and sitting.   Right: Without masses, retractions, discharge or axillary adenopathy.   Left: Without masses, retractions, discharge or axillary adenopathy. Genitourinary   Inguinal/mons:  Normal without inguinal adenopathy  External genitalia:  Normal appearing vulva with no masses, tenderness, or lesions  BUS/Urethra/Skene's glands:  Normal  Vagina:  Normal appearing with normal color and discharge, no lesions  Cervix:   Normal appearing without discharge or lesions. IUD string visible   Uterus:  Normal in size, shape and contour.  Midline and mobile, nontender  Adnexa/parametria:     Rt: Normal in size, without masses or tenderness.   Lt: Normal in size, without masses or tenderness.  Anus and perineum: Normal  Digital rectal exam: Normal sphincter tone without palpated masses or tenderness  Patient informed chaperone available to be present for breast and pelvic exam. Patient has requested no chaperone to be present. Patient has been advised what will be completed during breast and pelvic exam.  Assessment/Plan:  45 y.o. [id number] for annual exam.   Well female exam with routine gynecological exam - Plan: CBC with Differential/Platelet, Comprehensive metabolic panel. Education provided on SBEs, importance of preventative screenings, current guidelines, high calcium diet, regular exercise, and multivitamin daily.   Encounter for routine checking of intrauterine contraceptive device (IUD) - amenorrheic. Mirena inserted 01/2018. She is aware of 8-year FDA approval for pregnancy prevention.   Hot flashes - Plan: venlafaxine XR (EFFEXOR-XR) 37.5 MG 24 hr capsule daily. Taking as prescribed. Refill x 1 year provided.   Anxiety - Plan: venlafaxine XR (EFFEXOR-XR) 37.5 MG 24 hr capsule daily.   Screening for cervical cancer - Normal pap history. Will repeat at 3-year interval per guidelines.   Screening for breast cancer - History stable benign masses on left breast.  Overdue but scheduled for 01/24/2021. Normal breast exam today.   Screening for colon cancer - Average risk. Discussed current guidelines and recommendations to start screening at age 36. Information provided on [facility].   Follow-up in 1 year for annual.      [name] [facility], 9:00 AM 01/04/2021

## 2021-01-04 NOTE — Patient Instructions (Signed)
Schedule Colonoscopy! ? GI ?[phone] ?[address] [place], [place] [address] ? ?

## 2021-01-09 DIAGNOSIS — M9903 Segmental and somatic dysfunction of lumbar region: Secondary | ICD-10-CM | POA: Diagnosis not present

## 2021-01-09 DIAGNOSIS — M5386 Other specified dorsopathies, lumbar region: Secondary | ICD-10-CM | POA: Diagnosis not present

## 2021-01-09 DIAGNOSIS — M9905 Segmental and somatic dysfunction of pelvic region: Secondary | ICD-10-CM | POA: Diagnosis not present

## 2021-01-09 DIAGNOSIS — M9902 Segmental and somatic dysfunction of thoracic region: Secondary | ICD-10-CM | POA: Diagnosis not present

## 2021-01-11 DIAGNOSIS — M9905 Segmental and somatic dysfunction of pelvic region: Secondary | ICD-10-CM | POA: Diagnosis not present

## 2021-01-11 DIAGNOSIS — M9903 Segmental and somatic dysfunction of lumbar region: Secondary | ICD-10-CM | POA: Diagnosis not present

## 2021-01-11 DIAGNOSIS — M9902 Segmental and somatic dysfunction of thoracic region: Secondary | ICD-10-CM | POA: Diagnosis not present

## 2021-01-11 DIAGNOSIS — M5386 Other specified dorsopathies, lumbar region: Secondary | ICD-10-CM | POA: Diagnosis not present

## 2021-01-16 DIAGNOSIS — M9902 Segmental and somatic dysfunction of thoracic region: Secondary | ICD-10-CM | POA: Diagnosis not present

## 2021-01-16 DIAGNOSIS — M9905 Segmental and somatic dysfunction of pelvic region: Secondary | ICD-10-CM | POA: Diagnosis not present

## 2021-01-16 DIAGNOSIS — M5386 Other specified dorsopathies, lumbar region: Secondary | ICD-10-CM | POA: Diagnosis not present

## 2021-01-16 DIAGNOSIS — M9903 Segmental and somatic dysfunction of lumbar region: Secondary | ICD-10-CM | POA: Diagnosis not present

## 2021-01-18 DIAGNOSIS — M9905 Segmental and somatic dysfunction of pelvic region: Secondary | ICD-10-CM | POA: Diagnosis not present

## 2021-01-18 DIAGNOSIS — M5386 Other specified dorsopathies, lumbar region: Secondary | ICD-10-CM | POA: Diagnosis not present

## 2021-01-18 DIAGNOSIS — M9903 Segmental and somatic dysfunction of lumbar region: Secondary | ICD-10-CM | POA: Diagnosis not present

## 2021-01-18 DIAGNOSIS — M9902 Segmental and somatic dysfunction of thoracic region: Secondary | ICD-10-CM | POA: Diagnosis not present

## 2021-01-23 DIAGNOSIS — M9903 Segmental and somatic dysfunction of lumbar region: Secondary | ICD-10-CM | POA: Diagnosis not present

## 2021-01-23 DIAGNOSIS — M9902 Segmental and somatic dysfunction of thoracic region: Secondary | ICD-10-CM | POA: Diagnosis not present

## 2021-01-23 DIAGNOSIS — M9905 Segmental and somatic dysfunction of pelvic region: Secondary | ICD-10-CM | POA: Diagnosis not present

## 2021-01-23 DIAGNOSIS — M5386 Other specified dorsopathies, lumbar region: Secondary | ICD-10-CM | POA: Diagnosis not present

## 2021-01-24 ENCOUNTER — Ambulatory Visit
Admission: RE | Admit: 2021-01-24 | Discharge: 2021-01-24 | Disposition: A | Discharge status: Home / Self Care | Payer: BC Managed Care – PPO | Source: Ambulatory Visit | Attending: Nurse Practitioner | Admitting: Nurse Practitioner

## 2021-01-24 DIAGNOSIS — R922 Inconclusive mammogram: Secondary | ICD-10-CM | POA: Diagnosis not present

## 2021-01-24 DIAGNOSIS — N6320 Unspecified lump in the left breast, unspecified quadrant: Secondary | ICD-10-CM

## 2021-01-30 DIAGNOSIS — M9902 Segmental and somatic dysfunction of thoracic region: Secondary | ICD-10-CM | POA: Diagnosis not present

## 2021-01-30 DIAGNOSIS — M9903 Segmental and somatic dysfunction of lumbar region: Secondary | ICD-10-CM | POA: Diagnosis not present

## 2021-01-30 DIAGNOSIS — M5386 Other specified dorsopathies, lumbar region: Secondary | ICD-10-CM | POA: Diagnosis not present

## 2021-01-30 DIAGNOSIS — M9905 Segmental and somatic dysfunction of pelvic region: Secondary | ICD-10-CM | POA: Diagnosis not present

## 2021-02-01 DIAGNOSIS — M9905 Segmental and somatic dysfunction of pelvic region: Secondary | ICD-10-CM | POA: Diagnosis not present

## 2021-02-01 DIAGNOSIS — M9903 Segmental and somatic dysfunction of lumbar region: Secondary | ICD-10-CM | POA: Diagnosis not present

## 2021-02-01 DIAGNOSIS — M5386 Other specified dorsopathies, lumbar region: Secondary | ICD-10-CM | POA: Diagnosis not present

## 2021-02-01 DIAGNOSIS — M9902 Segmental and somatic dysfunction of thoracic region: Secondary | ICD-10-CM | POA: Diagnosis not present

## 2021-02-06 DIAGNOSIS — M5386 Other specified dorsopathies, lumbar region: Secondary | ICD-10-CM | POA: Diagnosis not present

## 2021-02-06 DIAGNOSIS — M9902 Segmental and somatic dysfunction of thoracic region: Secondary | ICD-10-CM | POA: Diagnosis not present

## 2021-02-06 DIAGNOSIS — M9905 Segmental and somatic dysfunction of pelvic region: Secondary | ICD-10-CM | POA: Diagnosis not present

## 2021-02-06 DIAGNOSIS — M9903 Segmental and somatic dysfunction of lumbar region: Secondary | ICD-10-CM | POA: Diagnosis not present

## 2021-02-08 DIAGNOSIS — M9905 Segmental and somatic dysfunction of pelvic region: Secondary | ICD-10-CM | POA: Diagnosis not present

## 2021-02-08 DIAGNOSIS — M9902 Segmental and somatic dysfunction of thoracic region: Secondary | ICD-10-CM | POA: Diagnosis not present

## 2021-02-08 DIAGNOSIS — M5386 Other specified dorsopathies, lumbar region: Secondary | ICD-10-CM | POA: Diagnosis not present

## 2021-02-08 DIAGNOSIS — M9903 Segmental and somatic dysfunction of lumbar region: Secondary | ICD-10-CM | POA: Diagnosis not present

## 2021-02-13 DIAGNOSIS — M9903 Segmental and somatic dysfunction of lumbar region: Secondary | ICD-10-CM | POA: Diagnosis not present

## 2021-02-13 DIAGNOSIS — M5386 Other specified dorsopathies, lumbar region: Secondary | ICD-10-CM | POA: Diagnosis not present

## 2021-02-13 DIAGNOSIS — M9902 Segmental and somatic dysfunction of thoracic region: Secondary | ICD-10-CM | POA: Diagnosis not present

## 2021-02-13 DIAGNOSIS — M9905 Segmental and somatic dysfunction of pelvic region: Secondary | ICD-10-CM | POA: Diagnosis not present

## 2021-02-20 DIAGNOSIS — M9903 Segmental and somatic dysfunction of lumbar region: Secondary | ICD-10-CM | POA: Diagnosis not present

## 2021-02-20 DIAGNOSIS — M9902 Segmental and somatic dysfunction of thoracic region: Secondary | ICD-10-CM | POA: Diagnosis not present

## 2021-02-20 DIAGNOSIS — M9905 Segmental and somatic dysfunction of pelvic region: Secondary | ICD-10-CM | POA: Diagnosis not present

## 2021-02-20 DIAGNOSIS — M5386 Other specified dorsopathies, lumbar region: Secondary | ICD-10-CM | POA: Diagnosis not present

## 2021-03-22 DIAGNOSIS — M5386 Other specified dorsopathies, lumbar region: Secondary | ICD-10-CM | POA: Diagnosis not present

## 2021-03-22 DIAGNOSIS — M9903 Segmental and somatic dysfunction of lumbar region: Secondary | ICD-10-CM | POA: Diagnosis not present

## 2021-03-22 DIAGNOSIS — M9902 Segmental and somatic dysfunction of thoracic region: Secondary | ICD-10-CM | POA: Diagnosis not present

## 2021-03-22 DIAGNOSIS — M9905 Segmental and somatic dysfunction of pelvic region: Secondary | ICD-10-CM | POA: Diagnosis not present

## 2021-04-24 DIAGNOSIS — M9903 Segmental and somatic dysfunction of lumbar region: Secondary | ICD-10-CM | POA: Diagnosis not present

## 2021-04-24 DIAGNOSIS — M9905 Segmental and somatic dysfunction of pelvic region: Secondary | ICD-10-CM | POA: Diagnosis not present

## 2021-04-24 DIAGNOSIS — M5386 Other specified dorsopathies, lumbar region: Secondary | ICD-10-CM | POA: Diagnosis not present

## 2021-04-24 DIAGNOSIS — M9902 Segmental and somatic dysfunction of thoracic region: Secondary | ICD-10-CM | POA: Diagnosis not present

## 2021-05-22 DIAGNOSIS — M9902 Segmental and somatic dysfunction of thoracic region: Secondary | ICD-10-CM | POA: Diagnosis not present

## 2021-05-22 DIAGNOSIS — M9905 Segmental and somatic dysfunction of pelvic region: Secondary | ICD-10-CM | POA: Diagnosis not present

## 2021-05-22 DIAGNOSIS — M5386 Other specified dorsopathies, lumbar region: Secondary | ICD-10-CM | POA: Diagnosis not present

## 2021-05-22 DIAGNOSIS — M9903 Segmental and somatic dysfunction of lumbar region: Secondary | ICD-10-CM | POA: Diagnosis not present

## 2021-06-01 IMAGING — MG DIGITAL DIAGNOSTIC BILAT W/ TOMO
8 series · 8 of 24 positions shown · non-contrast
Comparison: Previous exam(s).

CLINICAL DATA: 43-year-old patient presents for annual mammogram
and 1 year follow-up of probably benign masses in the left breast.

EXAM:
DIGITAL DIAGNOSTIC BILATERAL MAMMOGRAM WITH CAD AND TOMO
ULTRASOUND LEFT BREAST

[R MLO synth-2D]
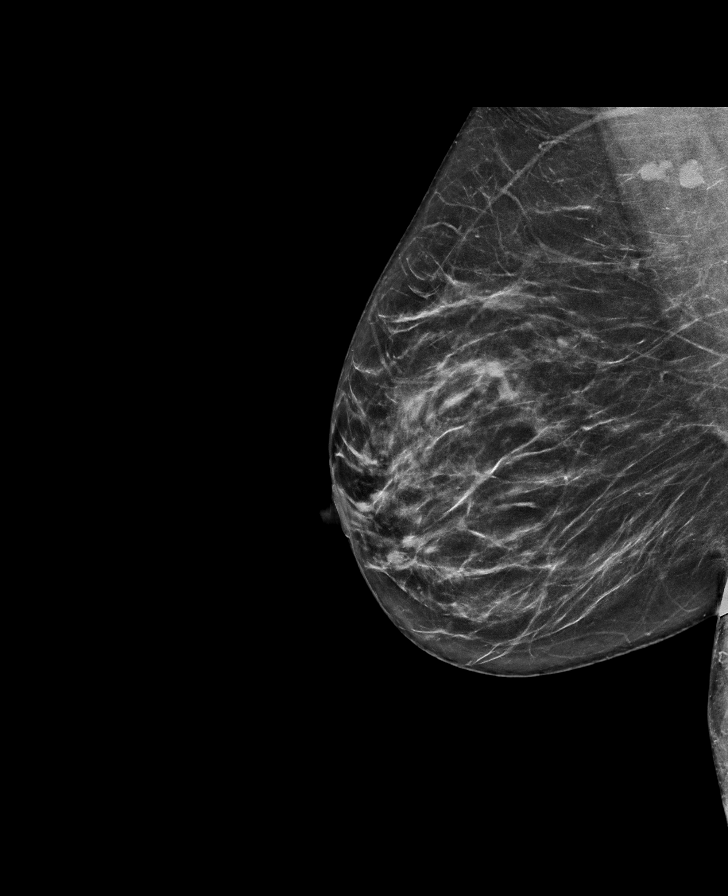

[L CC synth-2D]
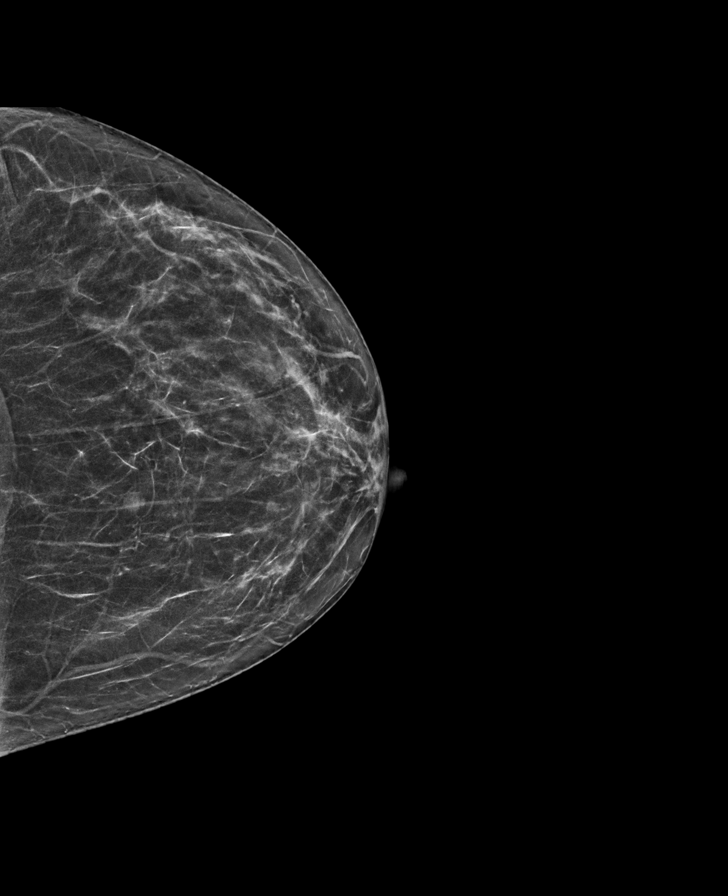

[R CC synth-2D]
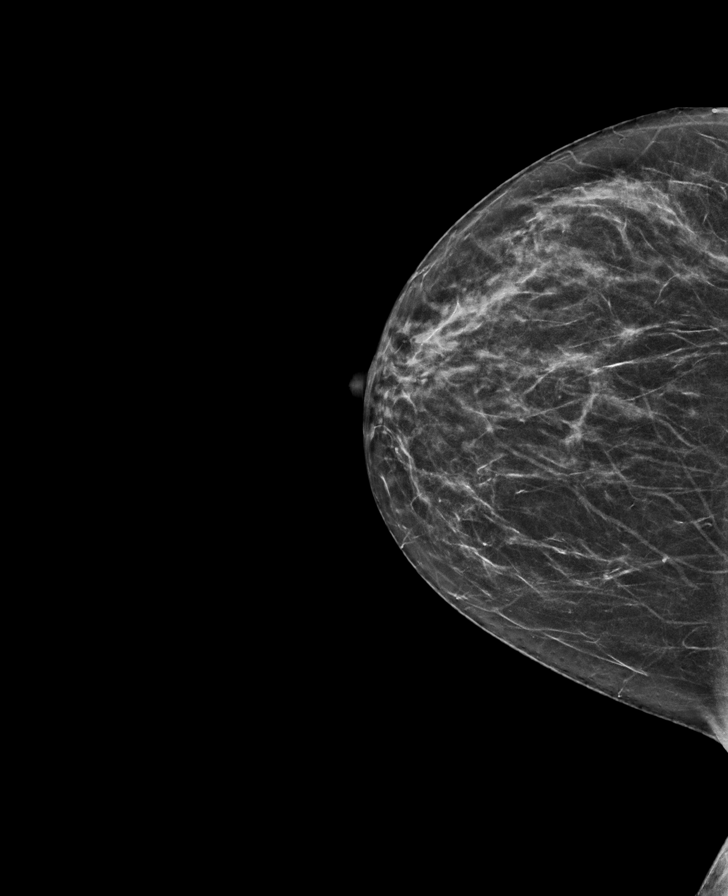

[L MLO synth-2D]
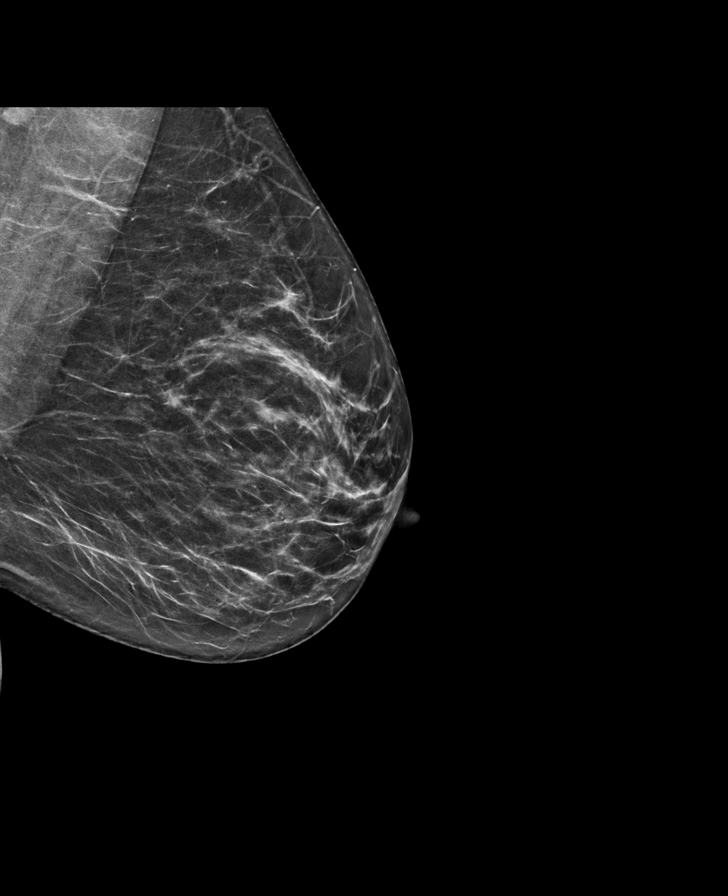

[L CC tomo · tomo slice 31/62.0]
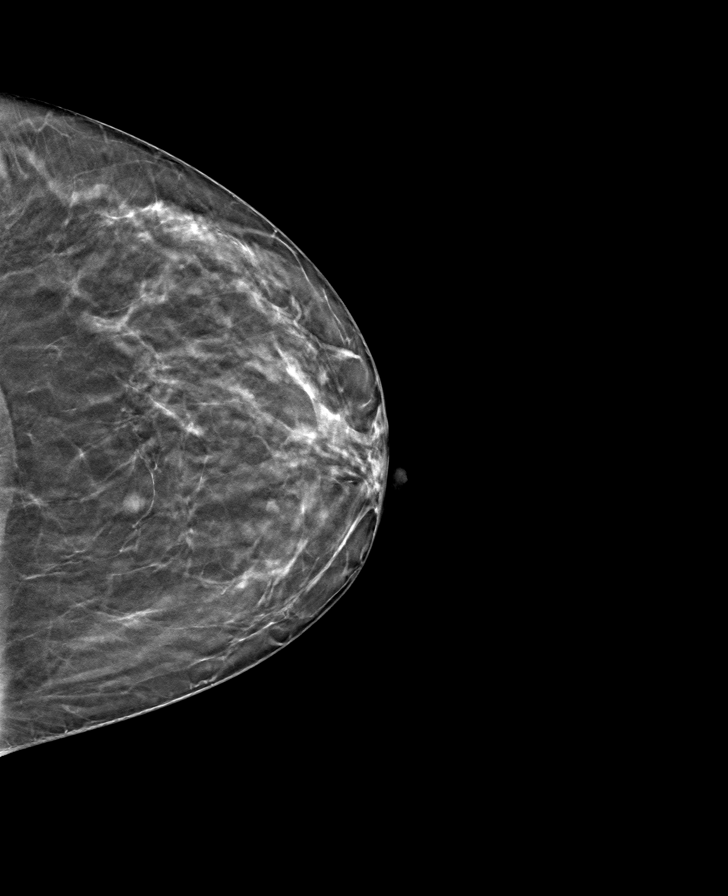

[L MLO tomo · tomo slice 35/68.0]
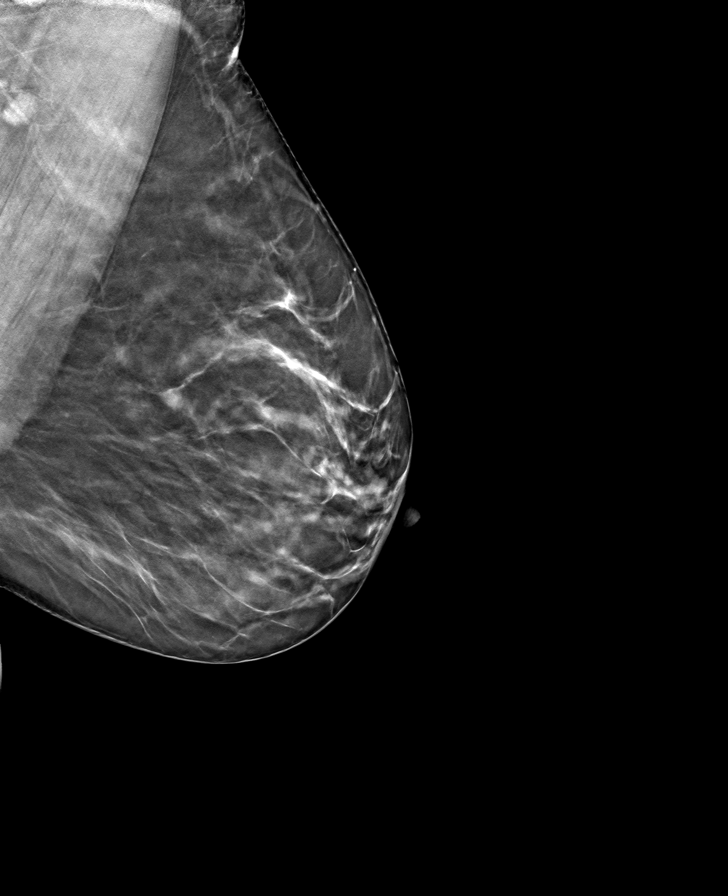

[R MLO tomo · tomo slice 35/70.0]
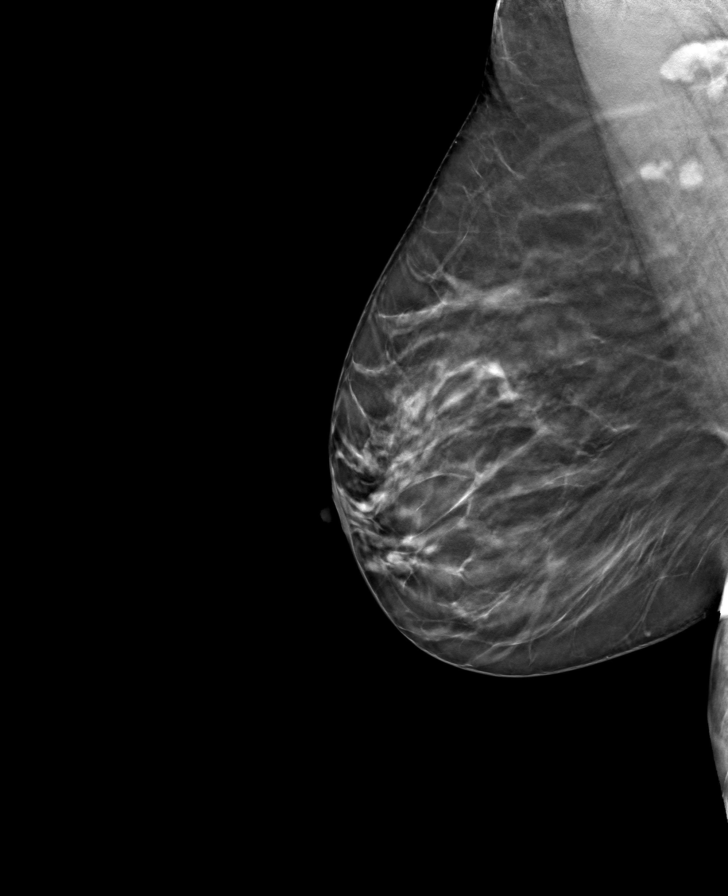

[R CC tomo · tomo slice 30/59.0]
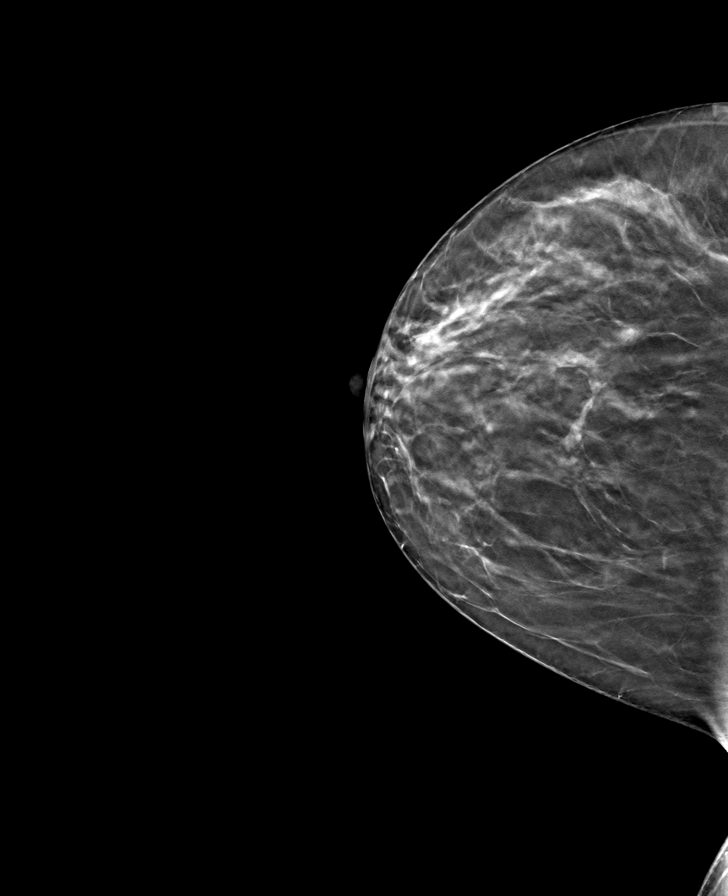

[8 of 24 positions shown; findings below may reference images not displayed]

ACR Breast Density Category b: There are scattered areas of
fibroglandular density.
FINDINGS: No suspicious mass, distortion, or suspicious microcalcification is
identified in either breast to suggest malignancy.

Mammographic images were processed with CAD.

Targeted ultrasound is performed, showing 3 similar-appearing
circumscribed oval parallel masses in the 11 o'clock position of the
left breast.

At 11 o'clock position 4 cm from the nipple adjacent and parallel to
the pectoralis muscle is a 0.7 x 0.2 x 0.6 cm mass. Previously, in
December 2017 this measured 0.8 x 0.2 x 0.6 cm.

At 11 o'clock position 3 cm from the nipple are 2 masses. The more
superficial of the circumscribed masses measures 0.3 x 0.2 x 0.3 cm.
Previously, this measured 0.6 x 0.4 x 0.2 cm.

The slightly deeper mass in the 11 o'clock position 3 cm from the
nipple measures 0.5 x 0.2 x 0.4 cm. Previously this measures 0.4 x
0.2 x 0.4 cm.
IMPRESSION: Three stable probably benign masses in the left breast.

No evidence of malignancy in the right breast.

RECOMMENDATION:
Bilateral diagnostic mammogram and left breast ultrasound is
recommended in 1 year. This will complete a 2 year follow-up.

I have discussed the findings and recommendations with the patient.
If applicable, a reminder letter will be sent to the patient
regarding the next appointment.

BI-RADS CATEGORY  3: Probably benign.

## 2021-06-19 DIAGNOSIS — M5386 Other specified dorsopathies, lumbar region: Secondary | ICD-10-CM | POA: Diagnosis not present

## 2021-06-19 DIAGNOSIS — M9905 Segmental and somatic dysfunction of pelvic region: Secondary | ICD-10-CM | POA: Diagnosis not present

## 2021-06-19 DIAGNOSIS — M9903 Segmental and somatic dysfunction of lumbar region: Secondary | ICD-10-CM | POA: Diagnosis not present

## 2021-06-19 DIAGNOSIS — M9902 Segmental and somatic dysfunction of thoracic region: Secondary | ICD-10-CM | POA: Diagnosis not present

## 2021-06-20 DIAGNOSIS — B351 Tinea unguium: Secondary | ICD-10-CM | POA: Diagnosis not present

## 2021-07-11 DIAGNOSIS — B351 Tinea unguium: Secondary | ICD-10-CM | POA: Diagnosis not present

## 2021-07-17 DIAGNOSIS — M9903 Segmental and somatic dysfunction of lumbar region: Secondary | ICD-10-CM | POA: Diagnosis not present

## 2021-07-17 DIAGNOSIS — M5386 Other specified dorsopathies, lumbar region: Secondary | ICD-10-CM | POA: Diagnosis not present

## 2021-07-17 DIAGNOSIS — M9905 Segmental and somatic dysfunction of pelvic region: Secondary | ICD-10-CM | POA: Diagnosis not present

## 2021-07-17 DIAGNOSIS — M9902 Segmental and somatic dysfunction of thoracic region: Secondary | ICD-10-CM | POA: Diagnosis not present

## 2021-07-25 DIAGNOSIS — B351 Tinea unguium: Secondary | ICD-10-CM | POA: Diagnosis not present

## 2021-08-08 DIAGNOSIS — B351 Tinea unguium: Secondary | ICD-10-CM | POA: Diagnosis not present

## 2021-08-15 DIAGNOSIS — M5386 Other specified dorsopathies, lumbar region: Secondary | ICD-10-CM | POA: Diagnosis not present

## 2021-08-15 DIAGNOSIS — M9902 Segmental and somatic dysfunction of thoracic region: Secondary | ICD-10-CM | POA: Diagnosis not present

## 2021-08-15 DIAGNOSIS — M9905 Segmental and somatic dysfunction of pelvic region: Secondary | ICD-10-CM | POA: Diagnosis not present

## 2021-08-15 DIAGNOSIS — M9903 Segmental and somatic dysfunction of lumbar region: Secondary | ICD-10-CM | POA: Diagnosis not present

## 2021-08-23 DIAGNOSIS — B351 Tinea unguium: Secondary | ICD-10-CM | POA: Diagnosis not present

## 2021-09-07 DIAGNOSIS — B351 Tinea unguium: Secondary | ICD-10-CM | POA: Diagnosis not present

## 2021-09-19 DIAGNOSIS — M9905 Segmental and somatic dysfunction of pelvic region: Secondary | ICD-10-CM | POA: Diagnosis not present

## 2021-09-19 DIAGNOSIS — M9902 Segmental and somatic dysfunction of thoracic region: Secondary | ICD-10-CM | POA: Diagnosis not present

## 2021-09-19 DIAGNOSIS — M5386 Other specified dorsopathies, lumbar region: Secondary | ICD-10-CM | POA: Diagnosis not present

## 2021-09-19 DIAGNOSIS — M9903 Segmental and somatic dysfunction of lumbar region: Secondary | ICD-10-CM | POA: Diagnosis not present

## 2021-11-22 DIAGNOSIS — M5386 Other specified dorsopathies, lumbar region: Secondary | ICD-10-CM | POA: Diagnosis not present

## 2021-11-22 DIAGNOSIS — M9903 Segmental and somatic dysfunction of lumbar region: Secondary | ICD-10-CM | POA: Diagnosis not present

## 2021-11-22 DIAGNOSIS — M9902 Segmental and somatic dysfunction of thoracic region: Secondary | ICD-10-CM | POA: Diagnosis not present

## 2021-11-22 DIAGNOSIS — M9905 Segmental and somatic dysfunction of pelvic region: Secondary | ICD-10-CM | POA: Diagnosis not present

## 2021-12-14 DIAGNOSIS — B351 Tinea unguium: Secondary | ICD-10-CM | POA: Diagnosis not present

## 2021-12-20 DIAGNOSIS — M9903 Segmental and somatic dysfunction of lumbar region: Secondary | ICD-10-CM | POA: Diagnosis not present

## 2021-12-20 DIAGNOSIS — M9905 Segmental and somatic dysfunction of pelvic region: Secondary | ICD-10-CM | POA: Diagnosis not present

## 2021-12-20 DIAGNOSIS — M9902 Segmental and somatic dysfunction of thoracic region: Secondary | ICD-10-CM | POA: Diagnosis not present

## 2021-12-20 DIAGNOSIS — M5386 Other specified dorsopathies, lumbar region: Secondary | ICD-10-CM | POA: Diagnosis not present

## 2021-12-28 ENCOUNTER — Other Ambulatory Visit: Payer: Self-pay | Admitting: Nurse Practitioner

## 2021-12-28 DIAGNOSIS — Z1231 Encounter for screening mammogram for malignant neoplasm of breast: Secondary | ICD-10-CM

## 2022-01-09 ENCOUNTER — Encounter: Payer: Self-pay | Admitting: Nurse Practitioner

## 2022-01-09 ENCOUNTER — Ambulatory Visit (INDEPENDENT_AMBULATORY_CARE_PROVIDER_SITE_OTHER): Payer: BC Managed Care – PPO | Admitting: Nurse Practitioner

## 2022-01-09 VITALS — BP 118/82 | HR 86 | Ht 59.75 in | Wt 154.0 lb

## 2022-01-09 DIAGNOSIS — E7800 Pure hypercholesterolemia, unspecified: Secondary | ICD-10-CM

## 2022-01-09 DIAGNOSIS — Z30431 Encounter for routine checking of intrauterine contraceptive device: Secondary | ICD-10-CM

## 2022-01-09 DIAGNOSIS — R232 Flushing: Secondary | ICD-10-CM

## 2022-01-09 DIAGNOSIS — F419 Anxiety disorder, unspecified: Secondary | ICD-10-CM

## 2022-01-09 DIAGNOSIS — Z01419 Encounter for gynecological examination (general) (routine) without abnormal findings: Secondary | ICD-10-CM | POA: Diagnosis not present

## 2022-01-09 DIAGNOSIS — N951 Menopausal and female climacteric states: Secondary | ICD-10-CM

## 2022-01-09 DIAGNOSIS — Z1211 Encounter for screening for malignant neoplasm of colon: Secondary | ICD-10-CM

## 2022-01-09 LAB — COMPREHENSIVE METABOLIC PANEL
AG Ratio: 1.6 (calc) (ref 1.0–2.5)
ALT: 18 U/L (ref 6–29)
AST: 22 U/L (ref 10–35)
Albumin: 4.5 g/dL (ref 3.6–5.1)
Alkaline phosphatase (APISO): 72 U/L (ref 31–125)
BUN: 9 mg/dL (ref 7–25)
CO2: 30 mmol/L (ref 20–32)
Calcium: 9.6 mg/dL (ref 8.6–10.2)
Chloride: 104 mmol/L (ref 98–110)
Creat: 0.75 mg/dL (ref 0.50–0.99)
Globulin: 2.9 g/dL (calc) (ref 1.9–3.7)
Glucose, Bld: 88 mg/dL (ref 65–99)
Potassium: 5.3 mmol/L (ref 3.5–5.3)
Sodium: 141 mmol/L (ref 135–146)
Total Bilirubin: 0.4 mg/dL (ref 0.2–1.2)
Total Protein: 7.4 g/dL (ref 6.1–8.1)

## 2022-01-09 LAB — LIPID PANEL
Cholesterol: 187 mg/dL (ref <200)
HDL: 67 mg/dL (ref >=50)
LDL Cholesterol (Calc): 105 mg/dL (calc) — ABNORMAL HIGH
Non-HDL Cholesterol (Calc): 120 mg/dL (calc) (ref <130)
Total CHOL/HDL Ratio: 2.8 (calc) (ref <5.0)
Triglycerides: 60 mg/dL (ref <150)

## 2022-01-09 LAB — CBC WITH DIFFERENTIAL/PLATELET
Absolute Monocytes: 316 cells/uL (ref 200–950)
Basophils Absolute: 39 cells/uL (ref 0–200)
Basophils Relative: 1 %
Eosinophils Absolute: 78 cells/uL (ref 15–500)
Eosinophils Relative: 2 %
HCT: 40.9 % (ref 35.0–45.0)
Hemoglobin: 13.6 g/dL (ref 11.7–15.5)
Lymphs Abs: 2055 cells/uL (ref 850–3900)
MCH: 27.9 pg (ref 27.0–33.0)
MCHC: 33.3 g/dL (ref 32.0–36.0)
MCV: 83.8 fL (ref 80.0–100.0)
MPV: 11.8 fL (ref 7.5–12.5)
Monocytes Relative: 8.1 %
Neutro Abs: 1412 cells/uL — ABNORMAL LOW (ref 1500–7800)
Neutrophils Relative %: 36.2 %
Platelets: 233 10*3/uL (ref 140–400)
RBC: 4.88 10*6/uL (ref 3.80–5.10)
RDW: 13.4 % (ref 11.0–15.0)
Total Lymphocyte: 52.7 %
WBC: 3.9 10*3/uL (ref 3.8–10.8)

## 2022-01-09 MED ORDER — VENLAFAXINE HCL ER 37.5 MG PO CP24: 37.5000 mg | ORAL_CAPSULE | Freq: Every day | ORAL | 3 refills | Status: DC

## 2022-01-09 NOTE — Progress Notes (Signed)
Heather Anderson Oct 04, 1975 [id number]   History:  46 y.o. [id number] presents for annual exam without GYN complaints. Amenorrheic/Mirena IUD inserted 01/2018. Effexor for vasomotor symptoms and anxiety. Normal pap and mammogram history.   Gynecologic History No LMP recorded. (Menstrual status: IUD).   Contraception/Family planning: IUD Sexually active: Yes  Health Maintenance Last Pap: 01/04/2020. Results were: Normal, 3-year repeat Last mammogram: 01/24/2021. Results were: 3 stable benign masses of left breast x 2 years. Scheduled 01/29/2022 Last colonoscopy: Never Last Dexa: Not indicated  Past medical history, past surgical history, family history and social history were all reviewed and documented in the EPIC chart. Married. Works in Insurance underwriter. 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 Maternal aunt diagnosed with breast cancer at age 77.   ROS:  A ROS was performed and pertinent positives and negatives are included.  Exam:  Vitals:   01/09/22 0843  BP: 118/82  Pulse: 86  SpO2: 99%  Weight: 154 lb (69.9 kg)  Height: 4' 11.75" (1.518 m)     Body mass index is 30.33 kg/m.  General appearance:  Normal Thyroid:  Symmetrical, normal in size, without palpable masses or nodularity. Respiratory  Auscultation:  Clear without wheezing or rhonchi Cardiovascular  Auscultation:  Regular rate, without rubs, murmurs or gallops  Edema/varicosities:  Not grossly evident Abdominal  Soft,nontender, without masses, guarding or rebound.  Liver/spleen:  No organomegaly noted  Hernia:  None appreciated  Skin  Inspection:  Grossly normal   Breasts: Examined lying and sitting.   Right: Without masses, retractions, discharge or axillary adenopathy.   Left: Without masses, retractions, discharge or axillary adenopathy. Genitourinary   Inguinal/mons:  Normal without inguinal adenopathy  External genitalia:  Normal appearing vulva with no masses, tenderness, or lesions  BUS/Urethra/Skene's glands:  Normal  Vagina:   Normal appearing with normal color and discharge, no lesions  Cervix:  Normal appearing without discharge or lesions. IUD string visible   Uterus:  Normal in size, shape and contour.  Midline and mobile, nontender  Adnexa/parametria:     Rt: Normal in size, without masses or tenderness.   Lt: Normal in size, without masses or tenderness.  Anus and perineum: Normal  Digital rectal exam: Normal sphincter tone without palpated masses or tenderness  Patient informed chaperone available to be present for breast and pelvic exam. Patient has requested no chaperone to be present. Patient has been advised what will be completed during breast and pelvic exam.  Assessment/Plan:  46 y.o. [id number] for annual exam.   Well female exam with routine gynecological exam - Plan: CBC with Differential/Platelet, Comprehensive metabolic panel. Education provided on SBEs, importance of preventative screenings, current guidelines, high calcium diet, regular exercise, and multivitamin daily.   Encounter for routine checking of intrauterine contraceptive device (IUD) - amenorrheic. Mirena inserted 01/2018. She is aware of 8-year FDA approval.   Hot flashes - Plan: venlafaxine XR (EFFEXOR-XR) 37.5 MG 24 hr capsule daily. Taking as prescribed. Refill x 1 year provided.   Anxiety - Plan: venlafaxine XR (EFFEXOR-XR) 37.5 MG 24 hr capsule daily.   Screening for cervical cancer - Normal pap history. Will repeat at 3-year interval per guidelines.   Screening for breast cancer - History stable benign masses on left breast. X 2 years. Mammogram scheduled 01/29/2022. Normal breast exam today.   Screening for colon cancer - Average risk. Discussed current guidelines and importance of preventative screenings. Colonoscopy versus Cologuard reviewed. Plans to schedule colonoscopy where her mother had hers done.   Follow-up in 1 year for annual.  Tamela Gammon [facility], 9:07 AM 01/09/2022

## 2022-01-17 DIAGNOSIS — M9902 Segmental and somatic dysfunction of thoracic region: Secondary | ICD-10-CM | POA: Diagnosis not present

## 2022-01-17 DIAGNOSIS — M9903 Segmental and somatic dysfunction of lumbar region: Secondary | ICD-10-CM | POA: Diagnosis not present

## 2022-01-17 DIAGNOSIS — M5386 Other specified dorsopathies, lumbar region: Secondary | ICD-10-CM | POA: Diagnosis not present

## 2022-01-17 DIAGNOSIS — M9905 Segmental and somatic dysfunction of pelvic region: Secondary | ICD-10-CM | POA: Diagnosis not present

## 2022-01-29 ENCOUNTER — Ambulatory Visit
Admission: RE | Admit: 2022-01-29 | Discharge: 2022-01-29 | Disposition: A | Discharge status: Home / Self Care | Payer: BC Managed Care – PPO | Source: Ambulatory Visit | Attending: Nurse Practitioner | Admitting: Nurse Practitioner

## 2022-01-29 DIAGNOSIS — Z1231 Encounter for screening mammogram for malignant neoplasm of breast: Secondary | ICD-10-CM

## 2022-03-14 DIAGNOSIS — M9903 Segmental and somatic dysfunction of lumbar region: Secondary | ICD-10-CM | POA: Diagnosis not present

## 2022-03-14 DIAGNOSIS — M5386 Other specified dorsopathies, lumbar region: Secondary | ICD-10-CM | POA: Diagnosis not present

## 2022-03-14 DIAGNOSIS — M9902 Segmental and somatic dysfunction of thoracic region: Secondary | ICD-10-CM | POA: Diagnosis not present

## 2022-03-14 DIAGNOSIS — M9905 Segmental and somatic dysfunction of pelvic region: Secondary | ICD-10-CM | POA: Diagnosis not present

## 2022-03-16 DIAGNOSIS — B351 Tinea unguium: Secondary | ICD-10-CM | POA: Diagnosis not present

## 2022-04-11 DIAGNOSIS — M9905 Segmental and somatic dysfunction of pelvic region: Secondary | ICD-10-CM | POA: Diagnosis not present

## 2022-04-11 DIAGNOSIS — M5386 Other specified dorsopathies, lumbar region: Secondary | ICD-10-CM | POA: Diagnosis not present

## 2022-04-11 DIAGNOSIS — M9902 Segmental and somatic dysfunction of thoracic region: Secondary | ICD-10-CM | POA: Diagnosis not present

## 2022-04-11 DIAGNOSIS — M9903 Segmental and somatic dysfunction of lumbar region: Secondary | ICD-10-CM | POA: Diagnosis not present

## 2022-04-24 DIAGNOSIS — B351 Tinea unguium: Secondary | ICD-10-CM | POA: Diagnosis not present

## 2022-05-23 DIAGNOSIS — M5386 Other specified dorsopathies, lumbar region: Secondary | ICD-10-CM | POA: Diagnosis not present

## 2022-05-23 DIAGNOSIS — M9903 Segmental and somatic dysfunction of lumbar region: Secondary | ICD-10-CM | POA: Diagnosis not present

## 2022-05-23 DIAGNOSIS — M9902 Segmental and somatic dysfunction of thoracic region: Secondary | ICD-10-CM | POA: Diagnosis not present

## 2022-05-23 DIAGNOSIS — M9905 Segmental and somatic dysfunction of pelvic region: Secondary | ICD-10-CM | POA: Diagnosis not present

## 2022-06-19 DIAGNOSIS — B351 Tinea unguium: Secondary | ICD-10-CM | POA: Diagnosis not present

## 2022-06-20 DIAGNOSIS — M5386 Other specified dorsopathies, lumbar region: Secondary | ICD-10-CM | POA: Diagnosis not present

## 2022-06-20 DIAGNOSIS — M9905 Segmental and somatic dysfunction of pelvic region: Secondary | ICD-10-CM | POA: Diagnosis not present

## 2022-06-20 DIAGNOSIS — M9902 Segmental and somatic dysfunction of thoracic region: Secondary | ICD-10-CM | POA: Diagnosis not present

## 2022-06-20 DIAGNOSIS — M9903 Segmental and somatic dysfunction of lumbar region: Secondary | ICD-10-CM | POA: Diagnosis not present

## 2022-07-18 DIAGNOSIS — M5386 Other specified dorsopathies, lumbar region: Secondary | ICD-10-CM | POA: Diagnosis not present

## 2022-07-18 DIAGNOSIS — M9902 Segmental and somatic dysfunction of thoracic region: Secondary | ICD-10-CM | POA: Diagnosis not present

## 2022-07-18 DIAGNOSIS — M9903 Segmental and somatic dysfunction of lumbar region: Secondary | ICD-10-CM | POA: Diagnosis not present

## 2022-07-18 DIAGNOSIS — M9905 Segmental and somatic dysfunction of pelvic region: Secondary | ICD-10-CM | POA: Diagnosis not present

## 2022-08-15 DIAGNOSIS — M9902 Segmental and somatic dysfunction of thoracic region: Secondary | ICD-10-CM | POA: Diagnosis not present

## 2022-08-15 DIAGNOSIS — M9903 Segmental and somatic dysfunction of lumbar region: Secondary | ICD-10-CM | POA: Diagnosis not present

## 2022-08-15 DIAGNOSIS — M5386 Other specified dorsopathies, lumbar region: Secondary | ICD-10-CM | POA: Diagnosis not present

## 2022-08-15 DIAGNOSIS — M9905 Segmental and somatic dysfunction of pelvic region: Secondary | ICD-10-CM | POA: Diagnosis not present

## 2022-09-24 DIAGNOSIS — M5386 Other specified dorsopathies, lumbar region: Secondary | ICD-10-CM | POA: Diagnosis not present

## 2022-09-24 DIAGNOSIS — M9903 Segmental and somatic dysfunction of lumbar region: Secondary | ICD-10-CM | POA: Diagnosis not present

## 2022-09-24 DIAGNOSIS — M9905 Segmental and somatic dysfunction of pelvic region: Secondary | ICD-10-CM | POA: Diagnosis not present

## 2022-09-24 DIAGNOSIS — M9902 Segmental and somatic dysfunction of thoracic region: Secondary | ICD-10-CM | POA: Diagnosis not present

## 2022-10-23 DIAGNOSIS — M9905 Segmental and somatic dysfunction of pelvic region: Secondary | ICD-10-CM | POA: Diagnosis not present

## 2022-10-23 DIAGNOSIS — M9903 Segmental and somatic dysfunction of lumbar region: Secondary | ICD-10-CM | POA: Diagnosis not present

## 2022-10-23 DIAGNOSIS — M5386 Other specified dorsopathies, lumbar region: Secondary | ICD-10-CM | POA: Diagnosis not present

## 2022-10-23 DIAGNOSIS — M9902 Segmental and somatic dysfunction of thoracic region: Secondary | ICD-10-CM | POA: Diagnosis not present

## 2023-01-15 ENCOUNTER — Ambulatory Visit: Payer: BC Managed Care – PPO | Admitting: Nurse Practitioner

## 2023-01-29 ENCOUNTER — Ambulatory Visit (INDEPENDENT_AMBULATORY_CARE_PROVIDER_SITE_OTHER): Payer: Self-pay | Admitting: Nurse Practitioner

## 2023-01-29 ENCOUNTER — Other Ambulatory Visit (HOSPITAL_COMMUNITY)
Admission: RE | Admit: 2023-01-29 | Discharge: 2023-01-29 | Disposition: A | Discharge status: Home / Self Care | Payer: BC Managed Care – PPO | Source: Ambulatory Visit | Attending: Nurse Practitioner | Admitting: Nurse Practitioner

## 2023-01-29 ENCOUNTER — Encounter: Payer: Self-pay | Admitting: Nurse Practitioner

## 2023-01-29 ENCOUNTER — Other Ambulatory Visit: Payer: Self-pay | Admitting: Nurse Practitioner

## 2023-01-29 VITALS — BP 104/64 | HR 91 | Ht 59.25 in | Wt 144.0 lb

## 2023-01-29 DIAGNOSIS — N951 Menopausal and female climacteric states: Secondary | ICD-10-CM

## 2023-01-29 DIAGNOSIS — R232 Flushing: Secondary | ICD-10-CM

## 2023-01-29 DIAGNOSIS — E7800 Pure hypercholesterolemia, unspecified: Secondary | ICD-10-CM

## 2023-01-29 DIAGNOSIS — N760 Acute vaginitis: Secondary | ICD-10-CM

## 2023-01-29 DIAGNOSIS — B9689 Other specified bacterial agents as the cause of diseases classified elsewhere: Secondary | ICD-10-CM

## 2023-01-29 DIAGNOSIS — Z30431 Encounter for routine checking of intrauterine contraceptive device: Secondary | ICD-10-CM

## 2023-01-29 DIAGNOSIS — Z01419 Encounter for gynecological examination (general) (routine) without abnormal findings: Secondary | ICD-10-CM

## 2023-01-29 DIAGNOSIS — F419 Anxiety disorder, unspecified: Secondary | ICD-10-CM

## 2023-01-29 DIAGNOSIS — Z124 Encounter for screening for malignant neoplasm of cervix: Secondary | ICD-10-CM

## 2023-01-29 NOTE — Progress Notes (Signed)
Heather Anderson [birth year] [id number]   History:  47 y.o. [id number] presents for annual exam without GYN complaints. Amenorrheic/Mirena IUD inserted 01/2018. Effexor for vasomotor symptoms and anxiety. Normal pap and mammogram history.   Gynecologic History No LMP recorded. (Menstrual status: IUD).   Contraception/Family planning: IUD Sexually active: Yes  Health Maintenance Last Pap: 01/04/2020. Results were: Normal, 3-year repeat Last mammogram: 01/29/2022. Results were: Normal Last colonoscopy: Never Last Dexa: Not indicated  Past medical history, past surgical history, family history and social history were all reviewed and documented in the EPIC chart. Married. 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 Maternal aunt diagnosed with breast cancer at age 61.   ROS:  A ROS was performed and pertinent positives and negatives are included.  Exam:  Vitals:   01/29/23 0957  BP: 104/64  Pulse: 91  SpO2: 99%  Weight: 144 lb (65.3 kg)  Height: 4' 11.25" (1.505 m)      Body mass index is 28.84 kg/m.  General appearance:  Normal Thyroid:  Symmetrical, normal in size, without palpable masses or nodularity. Respiratory  Auscultation:  Clear without wheezing or rhonchi Cardiovascular  Auscultation:  Regular rate, without rubs, murmurs or gallops  Edema/varicosities:  Not grossly evident Abdominal  Soft,nontender, without masses, guarding or rebound.  Liver/spleen:  No organomegaly noted  Hernia:  None appreciated  Skin  Inspection:  Grossly normal   Breasts: Examined lying and sitting.   Right: Without masses, retractions, discharge or axillary adenopathy.   Left: Without masses, retractions, discharge or axillary adenopathy. Pelvic: External genitalia:  no lesions              Urethra:  normal appearing urethra with no masses, tenderness or lesions              Bartholins and Skenes: normal                  Vagina: normal appearing vagina with normal color and discharge, no lesions              Cervix: no lesions. IUD string visible Bimanual Exam:  Uterus:  no masses or tenderness              Adnexa: no mass, fullness, tenderness              Rectovaginal: Deferred              Anus:  normal, no lesions  Patient informed chaperone available to be present for breast and pelvic exam. Patient has requested no chaperone to be present. Patient has been advised what will be completed during breast and pelvic exam.  Assessment/Plan:  47 y.o. [id number] for annual exam.   Well female exam with routine gynecological exam - Plan: CBC with Differential/Platelet, Comprehensive metabolic panel. Education provided on SBEs, importance of preventative screenings, current guidelines, high calcium diet, regular exercise, and multivitamin daily. Will return for fasting labs.   Encounter for routine checking of intrauterine contraceptive device (IUD) - amenorrheic. Mirena inserted 01/2018. She is aware of 8-year FDA approval.   Perimenopausal vasomotor symptoms - Plan: venlafaxine XR (EFFEXOR-XR) 37.5 MG 24 hr capsule daily. Taking as prescribed. Refill x 1 year provided.   Anxiety - Plan: venlafaxine XR (EFFEXOR-XR) 37.5 MG 24 hr capsule daily. Good management. Anxiety also much better since switching jobs.   Cervical cancer screening - Plan: Cytology - PAP( [facility]). Normal pap history.  Elevated LDL cholesterol level - Plan: Lipid panel  Screening for breast cancer - Continue annual screenings. Normal breast exam today.   Screening for colon cancer - Average risk. Discussed current guidelines and importance of preventative screenings. Colonoscopy versus Cologuard reviewed. Plans to schedule colonoscopy.   Return in about 1 year (around 01/29/2024) for Annual.      [name] [facility], 10:28 AM 01/29/2023

## 2023-01-30 LAB — CYTOLOGY - PAP
Comment: NEGATIVE
Diagnosis: NEGATIVE
High risk HPV: NEGATIVE

## 2023-01-30 MED ORDER — METRONIDAZOLE 500 MG PO TABS: 500.0000 mg | ORAL_TABLET | Freq: Two times a day (BID) | ORAL | 0 refills | Status: AC

## 2023-01-30 NOTE — Addendum Note (Signed)
Addended byWyline Beady on: 01/30/2023 02:48 PM   Modules accepted: Orders

## 2023-03-19 ENCOUNTER — Ambulatory Visit: Payer: Self-pay | Admitting: Radiology

## 2024-05-07 ENCOUNTER — Ambulatory Visit: Payer: Self-pay | Admitting: Nurse Practitioner
